# Patient Record
Sex: Female | Born: 1970
Health system: Southern US, Community
[De-identification: ages and names within clinical notes are randomized; demographics above are authoritative.]

## PROBLEM LIST (undated history)

## (undated) HISTORY — PX: CHOLECYSTECTOMY: SHX55

---

## 1999-09-23 ENCOUNTER — Other Ambulatory Visit: Admission: RE | Admit: 1999-09-23 | Discharge: 1999-09-23 | Payer: Self-pay | Admitting: Obstetrics & Gynecology

## 1999-09-24 ENCOUNTER — Encounter (INDEPENDENT_AMBULATORY_CARE_PROVIDER_SITE_OTHER): Payer: Self-pay

## 1999-09-24 ENCOUNTER — Other Ambulatory Visit: Admission: RE | Admit: 1999-09-24 | Discharge: 1999-09-24 | Payer: Self-pay | Admitting: Obstetrics & Gynecology

## 1999-11-24 ENCOUNTER — Other Ambulatory Visit: Admission: RE | Admit: 1999-11-24 | Discharge: 1999-11-24 | Payer: Self-pay | Admitting: Obstetrics & Gynecology

## 2000-02-17 ENCOUNTER — Encounter: Payer: Self-pay | Admitting: Obstetrics & Gynecology

## 2000-02-17 ENCOUNTER — Ambulatory Visit (HOSPITAL_COMMUNITY): Admission: RE | Admit: 2000-02-17 | Discharge: 2000-02-17 | Payer: Self-pay | Admitting: Obstetrics & Gynecology

## 2000-03-17 ENCOUNTER — Encounter: Payer: Self-pay | Admitting: Obstetrics & Gynecology

## 2000-03-17 ENCOUNTER — Ambulatory Visit (HOSPITAL_COMMUNITY): Admission: RE | Admit: 2000-03-17 | Discharge: 2000-03-17 | Payer: Self-pay | Admitting: Obstetrics & Gynecology

## 2000-04-07 ENCOUNTER — Encounter: Payer: Self-pay | Admitting: Occupational Medicine

## 2000-04-07 ENCOUNTER — Encounter: Admission: RE | Admit: 2000-04-07 | Discharge: 2000-04-07 | Payer: Self-pay | Admitting: Occupational Medicine

## 2000-04-19 ENCOUNTER — Other Ambulatory Visit: Admission: RE | Admit: 2000-04-19 | Discharge: 2000-04-19 | Payer: Self-pay | Admitting: Obstetrics & Gynecology

## 2000-06-03 ENCOUNTER — Inpatient Hospital Stay (HOSPITAL_COMMUNITY): Admission: AD | Admit: 2000-06-03 | Discharge: 2000-06-03 | Payer: Self-pay | Admitting: Obstetrics and Gynecology

## 2000-06-13 ENCOUNTER — Inpatient Hospital Stay (HOSPITAL_COMMUNITY): Admission: AD | Admit: 2000-06-13 | Discharge: 2000-06-15 | Payer: Self-pay | Admitting: Obstetrics & Gynecology

## 2000-07-14 ENCOUNTER — Other Ambulatory Visit: Admission: RE | Admit: 2000-07-14 | Discharge: 2000-07-14 | Payer: Self-pay | Admitting: Obstetrics & Gynecology

## 2001-03-24 ENCOUNTER — Other Ambulatory Visit: Admission: RE | Admit: 2001-03-24 | Discharge: 2001-03-24 | Payer: Self-pay | Admitting: Obstetrics and Gynecology

## 2001-09-06 ENCOUNTER — Other Ambulatory Visit: Admission: RE | Admit: 2001-09-06 | Discharge: 2001-09-06 | Payer: Self-pay | Admitting: Obstetrics and Gynecology

## 2002-01-06 ENCOUNTER — Other Ambulatory Visit: Admission: RE | Admit: 2002-01-06 | Discharge: 2002-01-06 | Payer: Self-pay | Admitting: Obstetrics and Gynecology

## 2002-04-17 ENCOUNTER — Inpatient Hospital Stay (HOSPITAL_COMMUNITY): Admission: AD | Admit: 2002-04-17 | Discharge: 2002-04-17 | Payer: Self-pay | Admitting: Obstetrics and Gynecology

## 2002-08-08 ENCOUNTER — Other Ambulatory Visit: Admission: RE | Admit: 2002-08-08 | Discharge: 2002-08-08 | Payer: Self-pay | Admitting: Obstetrics and Gynecology

## 2002-09-22 ENCOUNTER — Ambulatory Visit (HOSPITAL_COMMUNITY): Admission: RE | Admit: 2002-09-22 | Discharge: 2002-09-22 | Payer: Self-pay | Admitting: Obstetrics and Gynecology

## 2002-09-22 ENCOUNTER — Encounter: Payer: Self-pay | Admitting: Obstetrics and Gynecology

## 2002-12-04 ENCOUNTER — Inpatient Hospital Stay (HOSPITAL_COMMUNITY): Admission: AD | Admit: 2002-12-04 | Discharge: 2002-12-04 | Payer: Self-pay | Admitting: Obstetrics and Gynecology

## 2003-02-13 ENCOUNTER — Inpatient Hospital Stay (HOSPITAL_COMMUNITY): Admission: AD | Admit: 2003-02-13 | Discharge: 2003-02-15 | Payer: Self-pay | Admitting: Obstetrics and Gynecology

## 2003-08-31 ENCOUNTER — Other Ambulatory Visit: Admission: RE | Admit: 2003-08-31 | Discharge: 2003-08-31 | Payer: Self-pay | Admitting: Obstetrics and Gynecology

## 2004-04-13 ENCOUNTER — Inpatient Hospital Stay (HOSPITAL_COMMUNITY): Admission: AD | Admit: 2004-04-13 | Discharge: 2004-04-13 | Payer: Self-pay | Admitting: Obstetrics and Gynecology

## 2004-04-23 ENCOUNTER — Ambulatory Visit (HOSPITAL_COMMUNITY): Admission: RE | Admit: 2004-04-23 | Discharge: 2004-04-23 | Payer: Self-pay | Admitting: Obstetrics and Gynecology

## 2004-11-03 ENCOUNTER — Other Ambulatory Visit: Admission: RE | Admit: 2004-11-03 | Discharge: 2004-11-03 | Payer: Self-pay | Admitting: Obstetrics and Gynecology

## 2005-02-12 ENCOUNTER — Other Ambulatory Visit: Admission: RE | Admit: 2005-02-12 | Discharge: 2005-02-12 | Payer: Self-pay | Admitting: Obstetrics and Gynecology

## 2005-06-11 ENCOUNTER — Inpatient Hospital Stay (HOSPITAL_COMMUNITY): Admission: AD | Admit: 2005-06-11 | Discharge: 2005-06-11 | Payer: Self-pay | Admitting: Obstetrics and Gynecology

## 2005-08-26 ENCOUNTER — Inpatient Hospital Stay (HOSPITAL_COMMUNITY): Admission: RE | Admit: 2005-08-26 | Discharge: 2005-08-28 | Payer: Self-pay | Admitting: Obstetrics and Gynecology

## 2005-10-13 ENCOUNTER — Other Ambulatory Visit: Admission: RE | Admit: 2005-10-13 | Discharge: 2005-10-13 | Payer: Self-pay | Admitting: Obstetrics and Gynecology

## 2007-03-03 ENCOUNTER — Ambulatory Visit (HOSPITAL_COMMUNITY): Admission: RE | Admit: 2007-03-03 | Discharge: 2007-03-03 | Payer: Self-pay | Admitting: Specialist

## 2007-11-06 ENCOUNTER — Inpatient Hospital Stay (HOSPITAL_COMMUNITY): Admission: AD | Admit: 2007-11-06 | Discharge: 2007-11-06 | Payer: Self-pay | Admitting: Obstetrics and Gynecology

## 2007-12-24 ENCOUNTER — Inpatient Hospital Stay (HOSPITAL_COMMUNITY): Admission: AD | Admit: 2007-12-24 | Discharge: 2007-12-25 | Payer: Self-pay | Admitting: Obstetrics and Gynecology

## 2007-12-24 ENCOUNTER — Encounter (INDEPENDENT_AMBULATORY_CARE_PROVIDER_SITE_OTHER): Payer: Self-pay | Admitting: Obstetrics and Gynecology

## 2008-01-26 ENCOUNTER — Encounter: Admission: RE | Admit: 2008-01-26 | Discharge: 2008-01-26 | Payer: Self-pay | Admitting: General Surgery

## 2008-02-03 ENCOUNTER — Ambulatory Visit (HOSPITAL_COMMUNITY): Admission: RE | Admit: 2008-02-03 | Discharge: 2008-02-03 | Payer: Self-pay | Admitting: General Surgery

## 2008-02-03 ENCOUNTER — Encounter (INDEPENDENT_AMBULATORY_CARE_PROVIDER_SITE_OTHER): Payer: Self-pay | Admitting: General Surgery

## 2008-02-12 ENCOUNTER — Emergency Department (HOSPITAL_COMMUNITY): Admission: EM | Admit: 2008-02-12 | Discharge: 2008-02-12 | Payer: Self-pay | Admitting: Emergency Medicine

## 2008-02-28 ENCOUNTER — Ambulatory Visit: Payer: Self-pay | Admitting: Gastroenterology

## 2008-02-28 DIAGNOSIS — R1013 Epigastric pain: Secondary | ICD-10-CM | POA: Insufficient documentation

## 2008-02-28 DIAGNOSIS — R740 Nonspecific elevation of levels of transaminase and lactic acid dehydrogenase [LDH]: Secondary | ICD-10-CM

## 2008-02-28 DIAGNOSIS — Q447 Other congenital malformations of liver: Secondary | ICD-10-CM

## 2008-02-28 DIAGNOSIS — Q4479 Other congenital malformations of liver: Secondary | ICD-10-CM | POA: Insufficient documentation

## 2008-02-28 DIAGNOSIS — Q445 Other congenital malformations of bile ducts: Secondary | ICD-10-CM

## 2008-02-28 DIAGNOSIS — Q441 Other congenital malformations of gallbladder: Secondary | ICD-10-CM | POA: Insufficient documentation

## 2008-02-29 LAB — CONVERTED CEMR LAB
Albumin: 4.3 g/dL (ref 3.5–5.2)
Basophils Absolute: 0 10*3/uL (ref 0.0–0.1)
Basophils Relative: 0.6 % (ref 0.0–3.0)
Eosinophils Relative: 3.8 % (ref 0.0–5.0)
GFR calc Af Amer: 104 mL/min
Lymphocytes Relative: 28.8 % (ref 12.0–46.0)
MCHC: 35.1 g/dL (ref 30.0–36.0)
MCV: 91.1 fL (ref 78.0–100.0)
Monocytes Absolute: 0.6 10*3/uL (ref 0.1–1.0)
Monocytes Relative: 6.7 % (ref 3.0–12.0)
Neutrophils Relative %: 60.1 % (ref 43.0–77.0)
Potassium: 4.2 meq/L (ref 3.5–5.1)
RDW: 13 % (ref 11.5–14.6)
Sodium: 140 meq/L (ref 135–145)
Total Bilirubin: 1.4 mg/dL — ABNORMAL HIGH (ref 0.3–1.2)
WBC: 8.3 10*3/uL (ref 4.5–10.5)

## 2008-03-05 LAB — CONVERTED CEMR LAB
HCV Ab: NEGATIVE
Hepatitis B Surface Ag: NEGATIVE

## 2008-08-17 ENCOUNTER — Inpatient Hospital Stay (HOSPITAL_COMMUNITY): Admission: RE | Admit: 2008-08-17 | Discharge: 2008-08-20 | Payer: Self-pay | Admitting: Neurosurgery

## 2010-07-22 LAB — COMPREHENSIVE METABOLIC PANEL
AST: 22 U/L (ref 0–37)
CO2: 27 mEq/L (ref 19–32)
Creatinine, Ser: 0.75 mg/dL (ref 0.4–1.2)
GFR calc Af Amer: >60 mL/min (ref >60)
Total Bilirubin: 1 mg/dL (ref 0.3–1.2)

## 2010-07-22 LAB — URINALYSIS, ROUTINE W REFLEX MICROSCOPIC
Bilirubin Urine: NEGATIVE
Glucose, UA: NEGATIVE mg/dL
Hgb urine dipstick: NEGATIVE
Ketones, ur: NEGATIVE mg/dL
pH: 6 (ref 5.0–8.0)

## 2010-07-22 LAB — DIFFERENTIAL
Basophils Absolute: 0 10*3/uL (ref 0.0–0.1)
Eosinophils Absolute: 0.2 10*3/uL (ref 0.0–0.7)
Eosinophils Relative: 3 % (ref 0–5)
Neutro Abs: 3.9 10*3/uL (ref 1.7–7.7)

## 2010-07-22 LAB — CBC
Hemoglobin: 12.5 g/dL (ref 12.0–15.0)
MCHC: 34.7 g/dL (ref 30.0–36.0)
MCV: 93.9 fL (ref 78.0–100.0)
Platelets: 266 10*3/uL (ref 150–400)
RBC: 3.85 MIL/uL — ABNORMAL LOW (ref 3.87–5.11)
WBC: 7.1 10*3/uL (ref 4.0–10.5)

## 2010-07-22 LAB — APTT: aPTT: 31 seconds (ref 24–37)

## 2010-07-22 LAB — TYPE AND SCREEN
ABO/RH(D): O NEG
Antibody Screen: NEGATIVE

## 2010-07-22 LAB — PROTIME-INR: INR: 0.9 (ref 0.00–1.49)

## 2010-07-22 LAB — ABO/RH: ABO/RH(D): O NEG

## 2010-08-26 NOTE — Discharge Summary (Signed)
[name], [name]               ACCOUNT NO.:  [id number]   MEDICAL RECORD NO.:  [id number]          PATIENT TYPE:  INP   LOCATION:  3039                         FACILITY:  [facility]   PHYSICIAN:  Payton Doughty, M.D.      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   PROCEDURE:  1. L5-S1 laminectomy.  2. Diskectomy.  3. Posterior lumbar interbody fusion.  4. Non-segmental pedicle screw fixation at L5-S1.   COMPLICATION:  None.   DISCHARGE STATUS:  Alive and well.   BODY OF TEXT:  A 40 year old right-handed white girl whose history and  physical is recounted in the chart.  She has had an increasing back pain  and plain films that showed slip at L5, S1.  She was admitted after  ascertaining normal laboratory values and underwent a fusion at L5-S1.  Postoperatively, she has done pretty well.  Foley was removed first  postoperative day.  Second postoperative day, she was up and walking  about with PT with a walker.  She is being discharged home today to the  care of her family slightly late because she had some nausea and  vomiting last night.  She is being discharged on Percocet for pain.   Her follow up will be in the [facility] in about a week for suture  removal.           ______________________________  Payton Doughty, M.D.     MWR/MEDQ  D:  08/20/2008  T:  08/21/2008  Job:  [id number]

## 2010-08-26 NOTE — H&P (Signed)
[name], [name]               ACCOUNT NO.:  [id number]   MEDICAL RECORD NO.:  [id number]          PATIENT TYPE:  INP   LOCATION:  3039                         FACILITY:  [facility]   PHYSICIAN:  Payton Doughty, M.D.      DATE OF BIRTH:  24-Jul-1970   DATE OF ADMISSION:  08/17/2008  DATE OF DISCHARGE:                              HISTORY & PHYSICAL   ADMITTING DIAGNOSIS:  Spondylolysis of L5 with a grade I slip of L5 on  S1.   BODY OF TEXT:  This is a 40 year old right-handed white girl who has had  back pain off and on for sometime, it got worse a year ago in July 2009.  She has pain in her back and her left leg.  MR at that time showed  spondylosis and pars defect and was offered an operation and decided she  did not want to pursue it.  She has visited a neurologist with a  question of MS.  It is not entirely clear whether or not she does have  it.  However, she has persistent increasing pain in her back and down  her leg and is now admitted for decompression and stabilization at L5-  S1.   MEDICAL HISTORY:  Benign.   MEDICATIONS:  She takes oral contraceptions, vitamin D, Vicodin, and  ibuprofen.   She gets itchy with TYLOX.   SURGICAL HISTORY:  Cholecystectomy in October 2009.   SOCIAL HISTORY:  She does not smoke or drink and is a Engineer, civil (consulting).   FAMILY HISTORY:  Her mother is 96 in good health.  Her father is 84 in  good health.   REVIEW OF SYSTEMS:  Remarkable for back pain and questionable MS.   PHYSICAL EXAMINATION:  HEENT:  Normal limits.  She has good range of  motion.  NECK:  Clear.  CHEST:  Clear.  CARDIAC:  Regular rate and rhythm.  ABDOMEN:  Benign.  There are no hepatosplenomegaly.  EXTREMITIES:  Without clubbing, cyanosis.  Peripheral pulses are good.  GU:  Deferred.  NEUROLOGIC:  She is awake, alert, and oriented.  Cranial nerves are  intact.  Motor exam showed 5/5 strength throughout the upper and lower  extremities.  Pulling with her arms bothers her back more  than pushing,  which causes the pain to go down the right leg.  Reflexes are 1 at the  knees, 1 at the left ankle, and absent at right ankle.  Straight leg  raise is negative.   MR demonstrates pars defect at L5 with a grade slip of L5 on S1, does  not move a great deal may be 3 mm with flexion and extension.   CLINICAL IMPRESSION:  Lumbar spondylolysis with spondylolisthesis.  She  has thought this over and would  like to proceed with fusion at this  time.   PLAN:  The plan is for posterior lumbar interbody fusion with pedicle  screw augmentation.  The risks and benefits have been discussed with her  and she wishes to proceed.           ______________________________  Payton Doughty,  M.D.     MWR/MEDQ  D:  08/17/2008  T:  08/18/2008  Job:  [id number]

## 2010-08-26 NOTE — Op Note (Signed)
NAMEPARASKEVI, FUNEZ               ACCOUNT NO.:  [id number]   MEDICAL RECORD NO.:  [id number]          PATIENT TYPE:  INP   LOCATION:  2899                         FACILITY:  [facility]   PHYSICIAN:  Payton Doughty, M.D.      DATE OF BIRTH:  11-Jun-1970   DATE OF PROCEDURE:  08/17/2008  DATE OF DISCHARGE:                               OPERATIVE REPORT   PREOPERATIVE DIAGNOSIS:  Spondylolysis with a grade 1 slip of L5-S1.   POSTOPERATIVE DIAGNOSIS:  Spondylolysis with a grade 1 slip of L5-S1.   PROCEDURES:  L5-S1 Gill procedure, posterior lumbar antibody fusion, and  nonsegmental pedicle screws and posterolateral arthrodesis.   SURGEON:  Payton Doughty, MD   ANESTHESIA:  General endotracheal.   PREPARATION:  Prepped and draped with alcohol wipe.   COMPLICATIONS:  None.   NURSE ASSISTANT:  Bedelia Person, MD   DOCTOR ASSISTANT:  Danae Orleans. Venetia Maxon, MD.   BODY OF TEXT:  A 40 year old with spondylolysis of L5 and a grade 1 slip  of L5-S1 taken to the operating room smoothly anesthetized, intubated,  and placed prone on the operating table.  Following shave, prep and  drape in usual sterile fashion, skin was infiltrated with 1% lidocaine  and 1:400,000 epinephrine.  Skin was incised from the bottom of S1 to  mid L4 and the lamina and transverse processes of L5 and S1 were exposed  bilaterally in subperiosteal plane.  Intraoperative x-ray confirmed  correctness level.  Having confirmed correctness level, the lamina  remaining pars reticularis and inferior facet of L5 was removed using a  high-speed drill and the Kerrison.  The rostral aspect of the superior  facet of S1 was removed.  This allowed access to the lateral recess and  the decompression of the L5 roots which were both quite large.  They  were completely decompressed, the right in particular was significantly  trapped in the slip.  Following complete decompression, diskectomy was  carried out bilaterally and then PEEK cages were placed 8  mm x 21 mm.  They were packed with bone graft, harvested from the facet joints.  The  pedicle screws were then placed in L5 and S1 using the standard  landmarks and they were connected by a rod and locked down.  Intraoperative x-ray showed good placement of cages, pedicle screws, and  rods.  The transverse process at 5 and the sacral ala were decorticated  with a high-speed drill and packed with BMP on the extender matrix.  Intraoperative x-ray showed good placement.  Successive layers of 2-0  Vicryl and 3-0 nylon were used to close.  Betadine Telfa dressing was  applied, made occlusive with OpSite.  The patient returned to recovery  room in good condition.           ______________________________  Payton Doughty, M.D.     MWR/MEDQ  D:  08/17/2008  T:  08/18/2008  Job:  [id number]

## 2010-08-26 NOTE — Op Note (Signed)
NAMECYNDE, MENARD               ACCOUNT NO.:  [id number]   MEDICAL RECORD NO.:  [id number]          PATIENT TYPE:  AMB   LOCATION:  SDS                          FACILITY:  [facility]   PHYSICIAN:  Lennie Muckle, MD      DATE OF BIRTH:  July 27, 1970   DATE OF PROCEDURE:  02/03/2008  DATE OF DISCHARGE:  02/03/2008                               OPERATIVE REPORT   PREOPERATIVE DIAGNOSIS:  Symptomatic cholelithiasis with elevated liver  enzymes.   POSTOPERATIVE DIAGNOSIS:  Symptomatic cholelithiasis.   PROCEDURE:  Laparoscopic cholecystectomy with intraoperative  cholangiogram.   SURGEON:  Amber L. Freida Busman, MD   ASSISTANT:  Angelia Mould. Derrell Lolling, MD   FINDINGS:  Thickened gallbladder wall, patent common bile duct, and  right and left hepatic ducts.   SPECIMEN:  Gallbladder.   ESTIMATED BLOOD LOSS:  Minimal amount of blood loss.   DRAINS:  No drains were placed.   INDICATION FOR PROCEDURE:  Ms. Barcelo is a 40 year old female I had  seen after multiple episodes of right upper quadrant pain.  She had also  had elevation of liver enzymes during her pregnancy of alkaline  phosphatase of 688.  She had an ultrasound, which showed stones but no  dilation of the common bile duct.  Repeat enzymes had a normal ALT and  AST of 19 and 28 respectively.  Last alkaline phosphatase on January 09, 2008, was 433.  I discussed with the patient.  I felt she had  biliary colic as well as she probably had passed the stone due to  elevation of her liver enzymes.  I discussed preoperatively performing a  laparoscopic cholecystectomy with cholangiogram.  I did talk with her  about the risk of the procedure including but not limited to bleeding,  infection, hematoma, bile duct injury, and conversion to an open  procedure.  She was given a booklet on the procedure.  Due to a  thickened wall of gallbladder, I placed her on Augmentin preoperatively.   DETAILS OF PROCEDURE:  Ms. Mondesir was identified in the  preoperative  holding area.  She received a gram of cefoxitin and was taken to the  operating room.  She was taken to the operating room and placed in  supine position.  After administration of general endotracheal  anesthesia, her abdomen was prepped and draped in usual sterile fashion.  A time-out procedure indicating the patient and procedure was performed.  I placed an incision at the infraumbilical region after anesthetizing  with lidocaine.  Grasped the fascia with a Kocher clamp and placed a  Veress needle into abdominal cavity.  After adequate pneumo-  insufflation, I placed an 11-mm trocar and reached the abdominal cavity.  Abdominal walls were visualized upon entry.  Upon inspection, there was  no evidence of injury upon placement of the trocar or the Veress needle.  The patient had her head elevated, was tilted to the left side down.  I  then placed 3 additional trocars under visualization with the camera.  One was placed in the epigastric region and 2 on the right side of the  abdomen.  The gallbladder was visualized in normal anatomic position in  the gallbladder fossa.  The fundus was retracted to the head of the  patient.  I retracted the infundibulum away from the liver bed.  Using  electrocautery and the Maryland forceps, I dissected around the neck of  the gallbladder.  There was a large cystic node, which was visualized.  I fully dissected out the cystic duct and the neck of the gallbladder.  This did seem so large.  I placed in a clip distally and partially  transected the cystic duct.  I placed a cholangiogram catheter inside  the cystic duct.  There was seemed to be slight resistance.  After I  clipped the cholangiocatheter in place and flushing, the contrast seemed  to go much more easily.  Cholangiogram revealed a patent common bile  duct and right and left hepatic ducts.  I think there was an air bubble  visualized proximally and there seemed to be no filling  defects.  I then  removed the catheter, placed 2 clips as well as placed the Endoloop  across the cystic duct.  I clipped and divided the cystic artery.  I  then removed the peritoneal attachments with electrocautery.  Gallbladder was placed in an EndoCatch bag and removed from the  umbilical incision.  I irrigated the abdomen with a liter of saline.  Upon final inspection of the liver bed, there was no evidence of  bleeding.  Clips were in place.  No evidence of abdominal organ injury.  I then closed the fascial defect with 0 Vicryl suture.  Final inspection  revealed a good closure of the fascia, no bleeding or organ injury.  Pneumoperitoneum was released.  Trocars were removed.  Skin was closed  with 4-0 Monocryl.  Dermabond was placed for final dressing.  The  patient was extubated and transferred to postanesthesia care unit in  stable condition.  She was given Vicodin for pain.  She will be able to  be discharged home later if she is doing okay with terms of keeping  liquids down and having pain control.  She will follow up with me in  approximately 2 or 3 weeks.      Lennie Muckle, MD  Electronically Signed     ALA/MEDQ  D:  02/03/2008  T:  02/04/2008  Job:  [id number]   cc:   Madelon Lips, MD  Huel Cote, M.D.

## 2010-08-26 NOTE — Discharge Summary (Signed)
NAMEJERMIKA, [name]               ACCOUNT NO.:  [id number]   MEDICAL RECORD NO.:  [id number]          PATIENT TYPE:  INP   LOCATION:  9113                          [facility]AC[facility]  [facility]   PHYSICIAN:  Huel Cote, M.D. DATE OF BIRTH:  [birth year]   DATE OF ADMISSION:  12/24/2007  DATE OF DISCHARGE:  12/25/2007                               DISCHARGE SUMMARY   DISCHARGE DIAGNOSES:  1. Term pregnancy at 30 and 5/7 weeks delivered.  2. Status post normal spontaneous precipitous vaginal delivery.  3. Isolated elevated alkaline phosphatase of unclear etiology.  4. Multiple sclerosis diagnosed prior to pregnancy.   DISCHARGE MEDICATIONS:  1. Motrin 600 mg every 6 hours.  2. Darvocet 1-2 tablets p.o. every 4 hours p.r.n.   DISCHARGE FOLLOWUP:  The patient is to follow up in the office in  approximately 2 weeks for a repeat alkaline phosphatase blood draw and  in 6 weeks for her full postpartum exam.   HOSPITAL COURSE:  The patient is a 40 year old, G6, P 3-0-2-3, was  admitted at 59 and 5/7 weeks' gestation with contractions in active  labor.  Cervix was 5 cm on arrival.  Prenatal care had been complicated  by advanced maternal age, and the patient declined any genetic  screening.  She also was diagnosed prior to pregnancy with multiple  sclerosis; however, this was stable off all medications.  She had an  isolated alkaline phosphatase level which reached greater than 600 and  was reviewed with Maternal Fetal Medicine which felt that this was  probably placental in origin, but did require follow up postpartum.   Prenatal labs are as follows:  O negative, antibody negative, RPR  nonreactive, rubella immune, hepatitis B surface antigen negative, HIV  negative, GC negative, Chlamydia negative, group B Strep negative, 1-  hour Glucola 120.   PAST OBSTETRICAL HISTORY:  She had three vaginal deliveries in 2002,  2004, and 2007, and two spontaneous miscarriages in 2004 and 2005.   PAST GYN  HISTORY:  History of a LEEP in 2003.   PAST SURGICAL HISTORY:  Wisdom teeth.   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   ALLERGIES:  None.   MEDICATIONS:  Prenatal vitamins.   Shortly after the patient reached Labor and Delivery, she was found to  be complete and had rupture of membranes performed and pushed well with  a normal spontaneous vaginal delivery of a vigorous female infant over an  intact perineum, Apgars were 9 and 9, weight was 7 pounds even.  There  was a double nuchal cord reduced over the head and also a true knot.  Placenta delivery was spontaneous with no lacerations.  Cervix and  rectum were intact, and the  placenta was sent to Pathology given the elevated alkaline phosphatase  with final pathology revealing no significant pathology.  The patient  was then admitted for routine postpartum care.  Discharge alkaline  phosphatase was 685, hemoglobin was 10.7, and she was discharged with  her medications and follow up as stated.  Huel Cote, M.D.  Electronically Signed     KR/MEDQ  D:  01/16/2008  T:  01/17/2008  Job:  [id number]

## 2010-08-29 NOTE — Discharge Summary (Signed)
[name], [name]                         ACCOUNT NO.:  [id number]   MEDICAL RECORD NO.:  [id number]                   PATIENT TYPE:  INP   LOCATION:  9147                                 FACILITY:  [facility]   PHYSICIAN:  Zenaida Niece, M.D.             DATE OF BIRTH:  02-28-1971   DATE OF ADMISSION:  02/13/2003  DATE OF DISCHARGE:  02/15/2003                                 DISCHARGE SUMMARY   ADMISSION DIAGNOSES:  Intrauterine pregnancy at 38 weeks.   DISCHARGE DIAGNOSES:  Intrauterine pregnancy at 38 weeks.   PROCEDURE:  On November 2 she had spontaneous vaginal delivery.   HISTORY AND PHYSICAL:  This is a 40 year old white female gravida 3, para 1-  0-1-1 with an EGA of 38+ weeks who presents for induction.  Prenatal care  uncomplicated.  She did receive RhoGAM for Rh negative.   PRENATAL LABORATORIES:  Blood type O- with a negative antibody screen.  Rubella immune.  Hepatitis B surface antigen negative.  HIV negative.  Gonorrhea and Chlamydia negative.  RPR nonreactive.  Group B Strep is  negative.   PAST OBSTETRICAL HISTORY:  In 2002 vaginal delivery of 7 pounds 8 ounces.  In 2004 spontaneous abortion.   GYN HISTORY:  LEEP in 2003.   PAST MEDICAL HISTORY:  Gastroesophageal reflux.   PAST SURGICAL HISTORY:  Wisdom tooth removal.   PHYSICAL EXAMINATION:  VITAL SIGNS:  She is afebrile with stable vital  signs.  Fetal heart tracing reactive with occasional contraction.  ABDOMEN:  Soft, nontender.  PELVIC:  Cervix is 1+, 70, -2 and amniotomy revealed clear fluid.   HOSPITAL COURSE:  The patient was admitted and started on Pitocin for  induction.  Huel Cote, M.D. then examined her and performed  amniotomy.  She progressed to active labor and received an epidural.  On the  afternoon of November 2 she got to complete, pushed well, and had a vaginal  delivery of a viable female infant with Apgars of 8 and 9 that weighed 7  pounds 10 ounces.  Nuchal cord x1 was  reduced.  Placenta delivered  spontaneous, was intact with a three vessel cord.  She had a small skid mark  at the right upper labia which was hemostatic and not repaired.  Estimated  blood loss 350 mL.  Postpartum she had no complications.  Pre delivery  hemoglobin 11.7, post delivery 10.1.  On postpartum day #2 she was stable  for discharge home.    DISCHARGE INSTRUCTIONS:  Regular diet.  Pelvic rest.  Follow up in six  weeks.  Medications are over-the-counter ibuprofen as needed.  She is given  our discharge pamphlet.  Zenaida Niece, M.D.    TDM/MEDQ  D:  02/15/2003  T:  02/15/2003  Job:  [id number]

## 2010-08-29 NOTE — Discharge Summary (Signed)
Ross, Madison               ACCOUNT NO.:  [id number]   MEDICAL RECORD NO.:  [id number]          PATIENT TYPE:  INP   LOCATION:  9122                          FACILITY:  [facility]   PHYSICIAN:  Huel Cote, M.D. DATE OF BIRTH:  Apr 04, 1971   DATE OF ADMISSION:  08/26/2005  DATE OF DISCHARGE:  08/28/2005                                 DISCHARGE SUMMARY   DISCHARGE DIAGNOSES:  1.  Term pregnancy at 38+ weeks, delivered.  2.  Status post normal spontaneous vaginal delivery.   DISCHARGE MEDICATIONS:  1.  Motrin 600 mg p.o. every six hours.  2.  Percocet one to two tablets p.o. every four hours p.r.n.   DISCHARGE FOLLOWUP:  The patient is to follow up in the office in six weeks  for routine postpartum examination.   HOSPITAL COURSE:  The patient is a 40 year old G5, P 2-0-2-2 who was  admitted at 38+ weeks gestation for induction of labor given term status and  favorable cervix.  Prenatal care was uncomplicated.   PRENATAL LABORATORIES:  O+, antibody negative, RPR nonreactive, rubella  immune, hepatitis B surface antigen negative, HIV declined, GC negative,  Chlamydia negative, group B Strep negative, triple screen normal.   PAST OBSTETRICAL HISTORY:  She had a vaginal delivery of a 7 pound 8 ounce  infant in 2002, 2004 she had spontaneous miscarriage, vaginal delivery in  November of 2004 of a 7 pound 10 ounce infant, and spontaneous miscarriage  in 2005.   PAST GYN HISTORY:  A LEEP in 2003, within normal after that.   PAST MEDICAL HISTORY:  Reflux disease.   PAST SURGICAL HISTORY:  Wisdom teeth.   No allergies.  No medications.  On admission she was afebrile with stable  vital signs.  Fetal heart rate was reactive.  Cervix was 51, plus, and -2.  She had rupture of membranes performed with clear fluid noted.  She reached  complete dilation and pushed well with a normal spontaneous vaginal delivery  of vigorous female infant over an intact  perineum.  Apgars were 9 and 9.   Weight was 7 pounds 8 ounces.  Placenta  delivered spontaneously.  She then admitted for routine postpartum care.  Her postpartum hemoglobin was 9.7.  By postpartum day #2 her pain was well  controlled and she was felt stable for discharge home.      Huel Cote, M.D.  Electronically Signed     KR/MEDQ  D:  09/23/2005  T:  09/23/2005  Job:  [id number]

## 2011-01-09 LAB — COMPREHENSIVE METABOLIC PANEL
ALT: 19
BUN: 4 — ABNORMAL LOW
CO2: 24
Creatinine, Ser: 0.5
GFR calc non Af Amer: >60
Potassium: 3.7

## 2011-01-09 LAB — URINALYSIS, ROUTINE W REFLEX MICROSCOPIC
Ketones, ur: >80 — AB
Leukocytes, UA: NEGATIVE
Protein, ur: 30 — AB
pH: 6

## 2011-01-09 LAB — URINE MICROSCOPIC-ADD ON

## 2011-01-13 LAB — CBC
HCT: 37.1
HCT: 38.8
Hemoglobin: 12.4
Hemoglobin: 12.9
MCHC: 33.4
MCV: 94.4
Platelets: 267
RDW: 13.7
RDW: 13.5
WBC: 13.2 — ABNORMAL HIGH

## 2011-01-13 LAB — POCT CARDIAC MARKERS
CKMB, poc: 1.8
Myoglobin, poc: 73.5

## 2011-01-13 LAB — COMPREHENSIVE METABOLIC PANEL
ALT: 289 — ABNORMAL HIGH
Albumin: 4.2
Alkaline Phosphatase: 222 — ABNORMAL HIGH
BUN: 8
Chloride: 103
Glucose, Bld: 96
Potassium: 4
Sodium: 137
Total Bilirubin: 1.5 — ABNORMAL HIGH
Total Protein: 7

## 2011-01-13 LAB — URINALYSIS, ROUTINE W REFLEX MICROSCOPIC
Bilirubin Urine: NEGATIVE
Glucose, UA: NEGATIVE
Hgb urine dipstick: NEGATIVE
Ketones, ur: NEGATIVE
Protein, ur: NEGATIVE
Urobilinogen, UA: 0.2

## 2011-01-13 LAB — URINE MICROSCOPIC-ADD ON

## 2011-01-13 LAB — LIPASE, BLOOD: Lipase: 29

## 2011-01-13 LAB — POCT PREGNANCY, URINE: Preg Test, Ur: NEGATIVE

## 2011-01-14 LAB — RPR: RPR Ser Ql: NONREACTIVE

## 2011-01-14 LAB — CBC
HCT: 31.7 — ABNORMAL LOW
Hemoglobin: 10.7 — ABNORMAL LOW
Hemoglobin: 11.9 — ABNORMAL LOW
RBC: 3.33 — ABNORMAL LOW
RBC: 3.74 — ABNORMAL LOW
WBC: 15.4 — ABNORMAL HIGH

## 2011-01-14 LAB — ALKALINE PHOSPHATASE: Alkaline Phosphatase: 685 — ABNORMAL HIGH

## 2011-01-14 LAB — RH IMMUNE GLOB WKUP(>/=20WKS)(NOT WOMEN'S HOSP): Fetal Screen: NEGATIVE

## 2014-04-25 ENCOUNTER — Other Ambulatory Visit (HOSPITAL_COMMUNITY): Payer: Self-pay | Admitting: Neurology

## 2014-04-25 DIAGNOSIS — G35 Multiple sclerosis: Secondary | ICD-10-CM

## 2014-05-09 ENCOUNTER — Ambulatory Visit (HOSPITAL_COMMUNITY): Payer: Self-pay

## 2014-05-14 ENCOUNTER — Ambulatory Visit (HOSPITAL_COMMUNITY)
Admission: RE | Admit: 2014-05-14 | Discharge: 2014-05-14 | Disposition: A | Discharge status: Home / Self Care | Payer: 59 | Source: Ambulatory Visit | Attending: Neurology | Admitting: Neurology

## 2014-05-14 DIAGNOSIS — G35 Multiple sclerosis: Secondary | ICD-10-CM

## 2014-05-14 DIAGNOSIS — G939 Disorder of brain, unspecified: Secondary | ICD-10-CM | POA: Insufficient documentation

## 2014-05-14 MED ORDER — GADOBENATE DIMEGLUMINE 529 MG/ML IV SOLN
17.0000 mL | Freq: Once | INTRAVENOUS | Status: AC | PRN
  Administered 2014-05-14: 17 mL via INTRAVENOUS

## 2015-06-17 ENCOUNTER — Ambulatory Visit (INDEPENDENT_AMBULATORY_CARE_PROVIDER_SITE_OTHER): Payer: 59

## 2015-06-17 ENCOUNTER — Encounter: Payer: Self-pay | Admitting: Podiatry

## 2015-06-17 ENCOUNTER — Ambulatory Visit (INDEPENDENT_AMBULATORY_CARE_PROVIDER_SITE_OTHER): Payer: 59 | Admitting: Podiatry

## 2015-06-17 VITALS — BP 114/74 | HR 87 | Resp 14

## 2015-06-17 DIAGNOSIS — R52 Pain, unspecified: Secondary | ICD-10-CM

## 2015-06-17 DIAGNOSIS — M722 Plantar fascial fibromatosis: Secondary | ICD-10-CM

## 2015-06-17 MED ORDER — METHYLPREDNISOLONE 4 MG PO TBPK: ORAL_TABLET | ORAL | Status: AC

## 2015-06-17 NOTE — Progress Notes (Signed)
Subjective:     Patient ID: [name], female   DOB: 01-Jan-1971, 45 y.o.   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 She presents the office today stating that her pain has been present on and off for over 1 year. She self diagnosed herself with plantar fasciitis and stopped wearing her flip-flops this initially helped but the pain has worsened. She has tried to self treat by wearing braces better shoes taking Mobitz that was prescribed and applying Aspercreme to her heels. She presents the office today stating that her pain is significant and that she has worked on weekend. She says the pain is severe upon rising from a sitting position. She also admits to toe walking which has led to muscle pain in the back of her legs. Finally, she says that her arch pain has now settled into her heels and that the right heel is more painful than the left heel. She presents the office today for an evaluation and treatment of her feet   Review of Systems     Objective:   Physical Exam GENERAL APPEARANCE: Alert, conversant. Appropriately groomed. No acute distress.  VASCULAR: Pedal pulses palpable at  [facility] and PT bilateral.  Capillary refill time is immediate to all digits,  Normal temperature gradient.  Digital hair growth is present bilateral  NEUROLOGIC: sensation is normal to 5.07 monofilament at 5/5 sites bilateral.  Light touch is intact bilateral, Muscle strength normal.  MUSCULOSKELETAL: acceptable muscle strength, tone and stability bilateral.  Intrinsic muscluature intact bilateral.  Rectus appearance of foot and digits noted bilateral. No pain along the course of the plantar fascia B/L.  Pain in heels at the insertion plantar fascia B/L  DERMATOLOGIC: skin color, texture, and turgor are within normal limits.  No preulcerative lesions or ulcers  are seen, no interdigital maceration noted.  No open  lesions present.  Digital nails are asymptomatic. No drainage noted.     Assessment:     Plantar fascitis B/L     Plan:     IE  Xrays reveal minimal calcification heels B/L.  Discussed icing her feet, stretching her feet, wear powerstep insoles and prescribe Medrol dosepak.  RTC 2 weeks for reevaluation.   Helane Gunther DPM

## 2015-07-15 ENCOUNTER — Ambulatory Visit: Payer: 59 | Admitting: Podiatry

## 2015-08-27 DIAGNOSIS — M79604 Pain in right leg: Secondary | ICD-10-CM | POA: Diagnosis not present

## 2015-08-27 DIAGNOSIS — I00 Rheumatic fever without heart involvement: Secondary | ICD-10-CM | POA: Diagnosis not present

## 2015-08-27 DIAGNOSIS — M79605 Pain in left leg: Secondary | ICD-10-CM | POA: Diagnosis not present

## 2015-08-27 DIAGNOSIS — M79671 Pain in right foot: Secondary | ICD-10-CM | POA: Diagnosis not present

## 2015-12-19 DIAGNOSIS — E559 Vitamin D deficiency, unspecified: Secondary | ICD-10-CM | POA: Diagnosis not present

## 2015-12-19 DIAGNOSIS — Z1151 Encounter for screening for human papillomavirus (HPV): Secondary | ICD-10-CM | POA: Diagnosis not present

## 2015-12-19 DIAGNOSIS — Z124 Encounter for screening for malignant neoplasm of cervix: Secondary | ICD-10-CM | POA: Diagnosis not present

## 2015-12-19 DIAGNOSIS — Z1389 Encounter for screening for other disorder: Secondary | ICD-10-CM | POA: Diagnosis not present

## 2015-12-19 DIAGNOSIS — Z6829 Body mass index (BMI) 29.0-29.9, adult: Secondary | ICD-10-CM | POA: Diagnosis not present

## 2015-12-19 DIAGNOSIS — Z0000 Encounter for general adult medical examination without abnormal findings: Secondary | ICD-10-CM | POA: Diagnosis not present

## 2015-12-19 DIAGNOSIS — Z01419 Encounter for gynecological examination (general) (routine) without abnormal findings: Secondary | ICD-10-CM | POA: Diagnosis not present

## 2015-12-19 DIAGNOSIS — Z1231 Encounter for screening mammogram for malignant neoplasm of breast: Secondary | ICD-10-CM | POA: Diagnosis not present

## 2015-12-19 DIAGNOSIS — Z130 Encounter for screening for diseases of the blood and blood-forming organs and certain disorders involving the immune mechanism: Secondary | ICD-10-CM | POA: Diagnosis not present

## 2016-01-13 ENCOUNTER — Emergency Department (HOSPITAL_BASED_OUTPATIENT_CLINIC_OR_DEPARTMENT_OTHER)
Admission: EM | Admit: 2016-01-13 | Discharge: 2016-01-13 | Disposition: A | Discharge status: Home / Self Care | Payer: 59 | Attending: Emergency Medicine | Admitting: Emergency Medicine

## 2016-01-13 ENCOUNTER — Encounter (HOSPITAL_BASED_OUTPATIENT_CLINIC_OR_DEPARTMENT_OTHER): Payer: Self-pay | Admitting: Emergency Medicine

## 2016-01-13 DIAGNOSIS — E860 Dehydration: Secondary | ICD-10-CM | POA: Diagnosis not present

## 2016-01-13 DIAGNOSIS — R112 Nausea with vomiting, unspecified: Secondary | ICD-10-CM | POA: Diagnosis present

## 2016-01-13 DIAGNOSIS — K529 Noninfective gastroenteritis and colitis, unspecified: Secondary | ICD-10-CM

## 2016-01-13 DIAGNOSIS — Z79899 Other long term (current) drug therapy: Secondary | ICD-10-CM | POA: Insufficient documentation

## 2016-01-13 LAB — CBC WITH DIFFERENTIAL/PLATELET
BASOS ABS: 0 10*3/uL (ref 0.0–0.1)
BASOS PCT: 0 %
EOS ABS: 0.1 10*3/uL (ref 0.0–0.7)
EOS PCT: 0 %
HEMATOCRIT: 42.4 % (ref 36.0–46.0)
Hemoglobin: 13.9 g/dL (ref 12.0–15.0)
Lymphocytes Relative: 3 %
Lymphs Abs: 0.4 10*3/uL — ABNORMAL LOW (ref 0.7–4.0)
MCH: 31.8 pg (ref 26.0–34.0)
MCHC: 32.8 g/dL (ref 30.0–36.0)
MCV: 97 fL (ref 78.0–100.0)
MONO ABS: 0.4 10*3/uL (ref 0.1–1.0)
MONOS PCT: 3 %
NEUTROS ABS: 13.8 10*3/uL — AB (ref 1.7–7.7)
Neutrophils Relative %: 94 %
PLATELETS: 271 10*3/uL (ref 150–400)
RBC: 4.37 MIL/uL (ref 3.87–5.11)
RDW: 12.7 % (ref 11.5–15.5)
WBC: 14.7 10*3/uL — ABNORMAL HIGH (ref 4.0–10.5)

## 2016-01-13 LAB — COMPREHENSIVE METABOLIC PANEL
ALBUMIN: 4.5 g/dL (ref 3.5–5.0)
ALT: 16 U/L (ref 14–54)
ANION GAP: 7 (ref 5–15)
AST: 19 U/L (ref 15–41)
Alkaline Phosphatase: 47 U/L (ref 38–126)
BILIRUBIN TOTAL: 1.5 mg/dL — AB (ref 0.3–1.2)
BUN: 17 mg/dL (ref 6–20)
CHLORIDE: 105 mmol/L (ref 101–111)
CO2: 26 mmol/L (ref 22–32)
Calcium: 8.9 mg/dL (ref 8.9–10.3)
Creatinine, Ser: 0.84 mg/dL (ref 0.44–1.00)
GFR calc Af Amer: >60 mL/min (ref >60)
GFR calc non Af Amer: >60 mL/min (ref >60)
GLUCOSE: 121 mg/dL — AB (ref 65–99)
POTASSIUM: 4.4 mmol/L (ref 3.5–5.1)
SODIUM: 138 mmol/L (ref 135–145)
TOTAL PROTEIN: 7.4 g/dL (ref 6.5–8.1)

## 2016-01-13 LAB — LIPASE, BLOOD: Lipase: 30 U/L (ref 11–51)

## 2016-01-13 MED ORDER — SODIUM CHLORIDE 0.9 % IV BOLUS (SEPSIS)
1000.0000 mL | Freq: Once | INTRAVENOUS | Status: AC
  Administered 2016-01-13: 1000 mL via INTRAVENOUS

## 2016-01-13 MED ORDER — FAMOTIDINE IN NACL 20-0.9 MG/50ML-% IV SOLN
20.0000 mg | Freq: Once | INTRAVENOUS | Status: AC
  Administered 2016-01-13: 20 mg via INTRAVENOUS
  Filled 2016-01-13: qty 50

## 2016-01-13 MED ORDER — ONDANSETRON HCL 4 MG/2ML IJ SOLN
4.0000 mg | Freq: Once | INTRAMUSCULAR | Status: AC
  Administered 2016-01-13: 4 mg via INTRAVENOUS
  Filled 2016-01-13: qty 2

## 2016-01-13 MED ORDER — PROMETHAZINE HCL 25 MG PO TABS: 25.0000 mg | ORAL_TABLET | Freq: Four times a day (QID) | ORAL | 0 refills | Status: AC | PRN

## 2016-01-13 MED FILL — PROMETHAZINE 25 MG TABLET: 25 | 4 days supply | Qty: 15 | Fill #0

## 2016-01-13 NOTE — ED Triage Notes (Signed)
Pt with N/V/D symptoms since 0400. State she and husband both ate fish prepared by a restaurant and have since developed weakness from vomiting x6 times and diarrhea x6.

## 2016-01-13 NOTE — ED Provider Notes (Signed)
[facility] Provider [facility]   CSN: [id number] Arrival date & time: 01/13/16  5176     History   Chief Complaint Chief Complaint  Patient presents with  . Nausea  . Emesis  . Diarrhea  . Weakness    HPI [name] is a 45 y.o. female.  The history is provided by the patient.  Emesis   This is a new problem. The current episode started 3 to 5 hours ago. The problem occurs 5 to 10 times per day. The problem has not changed since onset.The emesis has an appearance of stomach contents. There has been no fever. Associated symptoms include abdominal pain and diarrhea. Associated symptoms comments: abd cramping. Risk factors include suspect food intake (she and husband ate fish last night and both are not feeling well).  Diarrhea   Associated symptoms include abdominal pain and vomiting.  Weakness  Associated symptoms include vomiting.    History reviewed. No pertinent past medical history.  Patient Active Problem List   Diagnosis Date Noted  . 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 EPIGASTRIC PAIN 02/28/2008  . ABNORMAL TRANSAMINASE-LFT'S 02/28/2008    Past Surgical History:  Procedure Laterality Date  . CHOLECYSTECTOMY      OB History    No data available       Home Medications    Prior to Admission medications   Medication Sig Start Date End Date Taking? Authorizing Provider  methylPREDNISolone (MEDROL DOSEPAK) 4 MG TBPK tablet As directed 06/17/15   Helane Gunther, DPM    Family History No family history on file.  Social History Social History  Substance Use Topics  . Smoking status: Never Smoker  . Smokeless tobacco: Never Used  . Alcohol use No     Allergies   Diclofenac; Diclofenac sodium; and Oxycodone-acetaminophen   Review of Systems Review of Systems  Gastrointestinal: Positive for abdominal pain, diarrhea and vomiting.  Neurological: Positive for weakness.  All other systems reviewed and are  negative.    Physical Exam Updated Vital Signs BP 124/89 (BP Location: Left Arm)   Pulse 115   Temp 97.9 F (36.6 C) (Oral)   Resp 18   Ht 5\' 5"  (1.651 m)   Wt 180 lb (81.6 kg)   SpO2 100%   BMI 29.95 kg/m   Physical Exam  Constitutional: She is oriented to person, place, and time. She appears well-developed and well-nourished. No distress.  HENT:  Head: Normocephalic and atraumatic.  Mouth/Throat: Oropharynx is clear and moist. Mucous membranes are dry.  Eyes: Conjunctivae and EOM are normal. Pupils are equal, round, and reactive to light.  Neck: Normal range of motion. Neck supple.  Cardiovascular: Normal rate, regular rhythm and intact distal pulses.   No murmur heard. Pulmonary/Chest: Effort normal and breath sounds normal. No respiratory distress. She has no wheezes. She has no rales.  Abdominal: Soft. She exhibits no distension. There is tenderness in the epigastric area. There is no rebound and no guarding.  Musculoskeletal: Normal range of motion. She exhibits no edema or tenderness.  Neurological: She is alert and oriented to person, place, and time.  Skin: Skin is warm and dry. No rash noted. No erythema.  Psychiatric: She has a normal mood and affect. Her behavior is normal.  Nursing note and vitals reviewed.    ED Treatments / Results  Labs (all labs ordered are listed, but only abnormal results are displayed) Labs Reviewed  CBC WITH DIFFERENTIAL/PLATELET - Abnormal; Notable for the following:  Result Value   WBC 14.7 (*)    Neutro Abs 13.8 (*)    Lymphs Abs 0.4 (*)    All other components within normal limits  COMPREHENSIVE METABOLIC PANEL - Abnormal; Notable for the following:    Glucose, Bld 121 (*)    Total Bilirubin 1.5 (*)    All other components within normal limits  LIPASE, BLOOD    EKG  EKG Interpretation None       Radiology No results found.  Procedures Procedures (including critical care time)  Medications Ordered in  ED Medications  ondansetron (ZOFRAN) injection 4 mg (not administered)  sodium chloride 0.9 % bolus 1,000 mL (not administered)     Initial Impression / Assessment and Plan / ED Course  I have reviewed the triage vital signs and the nursing notes.  Pertinent labs & imaging results that were available during my care of the patient were reviewed by me and considered in my medical decision making (see chart for details).  Clinical Course   Pt with symptoms most consistent with a viral process with fever/vomitting/diarrhea.  Denies bad food exposure and recent travel out of the country.  No recent abx.  No hx concerning for GU pathology or kidney stones.  Pt is awake and alert on exam without peritoneal signs.  Moderate epigastric tenderness.  No excessive etoh use. Leukocytosis but normal lft's and lipase.  Pt improved after IVF and zofran.  D/ced home with phenergan per pt request.    Final Clinical Impressions(s) / ED Diagnoses   Final diagnoses:  Gastroenteritis  Dehydration    New Prescriptions New Prescriptions   PROMETHAZINE (PHENERGAN) 25 MG TABLET    Take 1 tablet (25 mg total) by mouth every 6 (six) hours as needed for nausea or vomiting.     Gwyneth SproutWhitney Allen Egerton, MD 01/13/16 [phone]

## 2016-01-13 NOTE — ED Notes (Signed)
Pt fluids offered to pt. Tolerated well.

## 2016-01-13 NOTE — ED Notes (Signed)
MD at bedside. 

## 2016-01-13 NOTE — ED Notes (Signed)
Per EDP pt ready for discharge. EMT removing IV.

## 2017-02-16 DIAGNOSIS — S93601A Unspecified sprain of right foot, initial encounter: Secondary | ICD-10-CM | POA: Diagnosis not present

## 2017-02-16 DIAGNOSIS — S93401D Sprain of unspecified ligament of right ankle, subsequent encounter: Secondary | ICD-10-CM | POA: Diagnosis not present

## 2017-02-24 DIAGNOSIS — S63501S Unspecified sprain of right wrist, sequela: Secondary | ICD-10-CM | POA: Diagnosis not present

## 2017-02-24 DIAGNOSIS — E538 Deficiency of other specified B group vitamins: Secondary | ICD-10-CM | POA: Diagnosis not present

## 2017-02-24 DIAGNOSIS — Z1339 Encounter for screening examination for other mental health and behavioral disorders: Secondary | ICD-10-CM | POA: Diagnosis not present

## 2017-02-24 DIAGNOSIS — Z6830 Body mass index (BMI) 30.0-30.9, adult: Secondary | ICD-10-CM | POA: Diagnosis not present

## 2017-02-24 DIAGNOSIS — M19071 Primary osteoarthritis, right ankle and foot: Secondary | ICD-10-CM | POA: Diagnosis not present

## 2017-02-24 DIAGNOSIS — Z13220 Encounter for screening for lipoid disorders: Secondary | ICD-10-CM | POA: Diagnosis not present

## 2017-02-24 DIAGNOSIS — Z1331 Encounter for screening for depression: Secondary | ICD-10-CM | POA: Diagnosis not present

## 2017-02-24 DIAGNOSIS — E559 Vitamin D deficiency, unspecified: Secondary | ICD-10-CM | POA: Diagnosis not present

## 2017-03-02 DIAGNOSIS — M19071 Primary osteoarthritis, right ankle and foot: Secondary | ICD-10-CM | POA: Diagnosis not present

## 2017-03-02 DIAGNOSIS — D510 Vitamin B12 deficiency anemia due to intrinsic factor deficiency: Secondary | ICD-10-CM | POA: Diagnosis not present

## 2017-03-09 DIAGNOSIS — E538 Deficiency of other specified B group vitamins: Secondary | ICD-10-CM | POA: Diagnosis not present

## 2017-03-17 DIAGNOSIS — D510 Vitamin B12 deficiency anemia due to intrinsic factor deficiency: Secondary | ICD-10-CM | POA: Diagnosis not present

## 2017-04-16 DIAGNOSIS — E538 Deficiency of other specified B group vitamins: Secondary | ICD-10-CM | POA: Diagnosis not present

## 2017-07-15 DIAGNOSIS — Z124 Encounter for screening for malignant neoplasm of cervix: Secondary | ICD-10-CM | POA: Diagnosis not present

## 2017-07-15 DIAGNOSIS — Z1389 Encounter for screening for other disorder: Secondary | ICD-10-CM | POA: Diagnosis not present

## 2017-07-15 DIAGNOSIS — Z130 Encounter for screening for diseases of the blood and blood-forming organs and certain disorders involving the immune mechanism: Secondary | ICD-10-CM | POA: Diagnosis not present

## 2017-07-15 DIAGNOSIS — N939 Abnormal uterine and vaginal bleeding, unspecified: Secondary | ICD-10-CM | POA: Diagnosis not present

## 2017-07-15 DIAGNOSIS — Z01419 Encounter for gynecological examination (general) (routine) without abnormal findings: Secondary | ICD-10-CM | POA: Diagnosis not present

## 2017-07-15 DIAGNOSIS — H524 Presbyopia: Secondary | ICD-10-CM | POA: Diagnosis not present

## 2017-07-15 DIAGNOSIS — Z6830 Body mass index (BMI) 30.0-30.9, adult: Secondary | ICD-10-CM | POA: Diagnosis not present

## 2017-07-15 DIAGNOSIS — Z0000 Encounter for general adult medical examination without abnormal findings: Secondary | ICD-10-CM | POA: Diagnosis not present

## 2017-07-15 DIAGNOSIS — Z1231 Encounter for screening mammogram for malignant neoplasm of breast: Secondary | ICD-10-CM | POA: Diagnosis not present

## 2019-01-26 ENCOUNTER — Encounter (HOSPITAL_COMMUNITY): Payer: Self-pay | Admitting: Emergency Medicine

## 2019-01-26 ENCOUNTER — Emergency Department (HOSPITAL_COMMUNITY)
Admission: EM | Admit: 2019-01-26 | Discharge: 2019-01-26 | Disposition: A | Discharge status: Home / Self Care | Payer: 59 | Attending: Emergency Medicine | Admitting: Emergency Medicine

## 2019-01-26 ENCOUNTER — Emergency Department (HOSPITAL_COMMUNITY): Payer: 59

## 2019-01-26 ENCOUNTER — Other Ambulatory Visit: Payer: Self-pay

## 2019-01-26 DIAGNOSIS — R1010 Upper abdominal pain, unspecified: Secondary | ICD-10-CM | POA: Insufficient documentation

## 2019-01-26 DIAGNOSIS — Z79899 Other long term (current) drug therapy: Secondary | ICD-10-CM | POA: Insufficient documentation

## 2019-01-26 DIAGNOSIS — R109 Unspecified abdominal pain: Secondary | ICD-10-CM | POA: Diagnosis not present

## 2019-01-26 DIAGNOSIS — K429 Umbilical hernia without obstruction or gangrene: Secondary | ICD-10-CM | POA: Diagnosis not present

## 2019-01-26 DIAGNOSIS — R1011 Right upper quadrant pain: Secondary | ICD-10-CM | POA: Diagnosis not present

## 2019-01-26 LAB — CBC
HCT: 47.5 % — ABNORMAL HIGH (ref 36.0–46.0)
Hemoglobin: 15.2 g/dL — ABNORMAL HIGH (ref 12.0–15.0)
MCH: 31.7 pg (ref 26.0–34.0)
MCHC: 32 g/dL (ref 30.0–36.0)
MCV: 99 fL (ref 80.0–100.0)
Platelets: 322 10*3/uL (ref 150–400)
RBC: 4.8 MIL/uL (ref 3.87–5.11)
RDW: 12.8 % (ref 11.5–15.5)
WBC: 7.1 10*3/uL (ref 4.0–10.5)
nRBC: 0 % (ref 0.0–0.2)

## 2019-01-26 LAB — COMPREHENSIVE METABOLIC PANEL
ALT: 23 U/L (ref 0–44)
AST: 22 U/L (ref 15–41)
Albumin: 4.9 g/dL (ref 3.5–5.0)
Alkaline Phosphatase: 66 U/L (ref 38–126)
Anion gap: 10 (ref 5–15)
BUN: 12 mg/dL (ref 6–20)
CO2: 26 mmol/L (ref 22–32)
Calcium: 9.5 mg/dL (ref 8.9–10.3)
Chloride: 103 mmol/L (ref 98–111)
Creatinine, Ser: 0.79 mg/dL (ref 0.44–1.00)
GFR calc Af Amer: >60 mL/min (ref >60)
GFR calc non Af Amer: >60 mL/min (ref >60)
Glucose, Bld: 91 mg/dL (ref 70–99)
Potassium: 4 mmol/L (ref 3.5–5.1)
Sodium: 139 mmol/L (ref 135–145)
Total Bilirubin: 1.5 mg/dL — ABNORMAL HIGH (ref 0.3–1.2)
Total Protein: 8.7 g/dL — ABNORMAL HIGH (ref 6.5–8.1)

## 2019-01-26 LAB — URINALYSIS, ROUTINE W REFLEX MICROSCOPIC
Bilirubin Urine: NEGATIVE
Glucose, UA: NEGATIVE mg/dL
Hgb urine dipstick: NEGATIVE
Ketones, ur: NEGATIVE mg/dL
Leukocytes,Ua: NEGATIVE
Nitrite: NEGATIVE
Protein, ur: NEGATIVE mg/dL
Specific Gravity, Urine: 1.012 (ref 1.005–1.030)
pH: 7 (ref 5.0–8.0)

## 2019-01-26 LAB — I-STAT BETA HCG BLOOD, ED (MC, WL, AP ONLY): I-stat hCG, quantitative: <5 m[IU]/mL (ref <5)

## 2019-01-26 LAB — LIPASE, BLOOD: Lipase: 35 U/L (ref 11–51)

## 2019-01-26 MED ORDER — SUCRALFATE 1 G PO TABS: 1.0000 g | ORAL_TABLET | Freq: Four times a day (QID) | ORAL | 0 refills | Status: AC

## 2019-01-26 MED ORDER — OMEPRAZOLE 20 MG PO CPDR: 20.0000 mg | DELAYED_RELEASE_CAPSULE | Freq: Every day | ORAL | 0 refills | Status: AC

## 2019-01-26 MED ORDER — CYCLOBENZAPRINE HCL 10 MG PO TABS: 10.0000 mg | ORAL_TABLET | Freq: Two times a day (BID) | ORAL | 0 refills | Status: AC | PRN

## 2019-01-26 NOTE — Discharge Instructions (Signed)
Your CT scan showed no explanation for abdominal pain and your blood work is reassuring.  As discussed, we will treat this as peptic ulcer disease and he will take Prilosec for the next 14 days. Additionally you may use flexeril as needed for discomfort.  Please limit large meals as these elicit worsening pain.  Follow-up with with our gastroenterology for additional work-up outpatient. Use tylenol for pain please limit use of ibuprofen for the time being.   Please use sucralfate as well. This is a medication that provides an additional coating of protection for your stomach.

## 2019-01-26 NOTE — ED Triage Notes (Signed)
Per pt, states she has right upper back pain after eating heavy meals for over a month-states she had her gallbladder removed in 2012

## 2019-01-26 NOTE — ED Provider Notes (Signed)
[facility]-EMERGENCY DEPT Provider Note   CSN: [id number] Arrival date & time: 01/26/19  1106     History   Chief Complaint Chief Complaint  Patient presents with  . Abdominal Pain    HPI [name] is a 48 y.o. female history of GERD, back pain, and cholecystitis status post cholecystectomy 2012.  Patient complains of 1 month of intermittent back pain that radiates under her ribs bilaterally.  These episodes occur 1-2 times per week and occur 1 to 2 hours after eating a large meal.  Patient states that pain is 6/10 when it occurs and can last several hours.  Patient states the right side is worse than left under her rib cage.  Patient states most recent occurrence was last night when she ate at [facility] a large meal including steak and potatoes.  Patient states that she uses ibuprofen daily approximately 400 mg.  Patient also uses Nexium and Pepcid without relief.  Patient states no position makes the pain better although she occasionally gets mild relief with laying on her right side.  Patient denies any urinary symptoms, changes in bowel movements, vaginal discharge, concern for STIs, denies any chest pain.    HPI  History reviewed. No pertinent past medical history.  Patient Active Problem List   Diagnosis Date Noted  . 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 EPIGASTRIC PAIN 02/28/2008  . ABNORMAL TRANSAMINASE-LFT'S 02/28/2008    Past Surgical History:  Procedure Laterality Date  . CHOLECYSTECTOMY       OB History   No obstetric history on file.      Home Medications    Prior to Admission medications   Medication Sig Start Date End Date Taking? Authorizing Provider  acetaminophen (TYLENOL) 325 MG tablet Take 650 mg by mouth every 6 (six) hours as needed for mild pain or headache.   Yes [provider]  famotidine (PEPCID) 20 MG tablet Take 20 mg by mouth 2 (two) times daily as needed for heartburn  or indigestion.   Yes [provider]  Multiple Vitamin (MULTIVITAMIN WITH MINERALS) TABS tablet Take 1 tablet by mouth daily.   Yes [provider]  cyclobenzaprine (FLEXERIL) 10 MG tablet Take 1 tablet (10 mg total) by mouth 2 (two) times daily as needed for muscle spasms. 01/26/19   Gailen Shelter, PA  methylPREDNISolone (MEDROL DOSEPAK) 4 MG TBPK tablet As directed Patient not taking: Reported on 01/26/2019 06/17/15   Helane Gunther, DPM  omeprazole (PRILOSEC) 20 MG capsule Take 1 capsule (20 mg total) by mouth daily for 14 days. 01/26/19 02/09/19  Gailen Shelter, PA  promethazine (PHENERGAN) 25 MG tablet Take 1 tablet (25 mg total) by mouth every 6 (six) hours as needed for nausea or vomiting. Patient not taking: Reported on 01/26/2019 01/13/16   Gwyneth Sprout, MD  sucralfate (CARAFATE) 1 g tablet Take 1 tablet (1 g total) by mouth 4 (four) times daily for 14 days. 01/26/19 02/09/19  Gailen Shelter, PA    Family History No family history on file.  Social History Social History   Tobacco Use  . Smoking status: Never Smoker  . Smokeless tobacco: Never Used  Substance Use Topics  . Alcohol use: No    Alcohol/week: 0.0 standard drinks  . Drug use: No     Allergies   Diclofenac, Diclofenac sodium, and Oxycodone-acetaminophen   Review of Systems Review of Systems   Physical Exam Updated Vital Signs BP 107/75 (BP Location: Left  Arm)   Pulse 72   Temp 98.8 F (37.1 C) (Oral)   Resp 15   SpO2 96%   Physical Exam Vitals signs and nursing note reviewed.  Constitutional:      Appearance: She is obese.     Comments: Patient does not appear to be in any acute distress or pain however she does state that she hurts all over.  HENT:     Head: Normocephalic and atraumatic.     Nose: Nose normal.     Mouth/Throat:     Mouth: Mucous membranes are moist.  Eyes:     General: No scleral icterus.    Comments: Cloudy corneas  Neck:     Musculoskeletal:  Normal range of motion.  Cardiovascular:     Rate and Rhythm: Normal rate and regular rhythm.     Pulses: Normal pulses.     Heart sounds: Normal heart sounds.  Pulmonary:     Effort: Pulmonary effort is normal. No respiratory distress.     Breath sounds: No wheezing.  Abdominal:     General: There is no distension.     Palpations: Abdomen is soft.     Tenderness: There is abdominal tenderness. There is no right CVA tenderness, left CVA tenderness or guarding.     Comments: Right upper quadrant tenderness with negative Murphy sign.  Mild tenderness to palpation of right flank.  Musculoskeletal:     Right lower leg: No edema.     Left lower leg: No edema.     Comments: Mid thoracic back tenderness that does not reproduce discussed pain. No midline tenderness.   Skin:    General: Skin is warm and dry.     Capillary Refill: Capillary refill takes less than 2 seconds.  Neurological:     Mental Status: She is alert. Mental status is at baseline.  Psychiatric:        Mood and Affect: Mood normal.        Behavior: Behavior normal.      ED Treatments / Results  Labs (all labs ordered are listed, but only abnormal results are displayed) Labs Reviewed  COMPREHENSIVE METABOLIC PANEL - Abnormal; Notable for the following components:      Result Value   Total Protein 8.7 (*)    Total Bilirubin 1.5 (*)    All other components within normal limits  CBC - Abnormal; Notable for the following components:   Hemoglobin 15.2 (*)    HCT 47.5 (*)    All other components within normal limits  URINALYSIS, ROUTINE W REFLEX MICROSCOPIC - Abnormal; Notable for the following components:   APPearance HAZY (*)    All other components within normal limits  LIPASE, BLOOD  I-STAT BETA HCG BLOOD, ED ([facility], [facility], [facility] ONLY)    EKG None  Radiology Ct Renal Stone Study  Result Date: 01/26/2019 CLINICAL DATA:  Right flank pain. EXAM: CT ABDOMEN AND PELVIS WITHOUT CONTRAST TECHNIQUE: Multidetector CT  imaging of the abdomen and pelvis was performed following the standard protocol without IV contrast. COMPARISON:  02/12/2008 FINDINGS: Lower chest: No acute abnormality. Hepatobiliary: No focal liver abnormality is seen. Status post cholecystectomy. No biliary dilatation. Pancreas: Unremarkable. No pancreatic ductal dilatation or surrounding inflammatory changes. Spleen: Normal in size without focal abnormality. Adrenals/Urinary Tract: Adrenal glands are unremarkable. Kidneys are normal, without renal calculi, focal lesion, or hydronephrosis. Bladder is unremarkable. Stomach/Bowel: Stomach is within normal limits. Appendix appears normal. No evidence of bowel wall thickening, distention, or inflammatory changes. Vascular/Lymphatic:  No significant vascular findings are present. No enlarged abdominal or pelvic lymph nodes. Reproductive: Uterus and bilateral adnexa are unremarkable. Other: Small fat containing umbilical hernia. No ascites. Musculoskeletal: Posterior and interbody fusion of L5-S1. No acute osseous abnormality. IMPRESSION: 1. No acute abdominopelvic findings. Specifically, no nephrolithiasis or obstructive uropathy. 2. Status post cholecystectomy. 3. Small fat containing umbilical hernia. Electronically Signed   By: Davina Poke M.D.   On: 01/26/2019 14:39    Procedures Procedures (including critical care time)  Medications Ordered in ED Medications - No data to display   Initial Impression / Assessment and Plan / ED Course  I have reviewed the triage vital signs and the nursing notes.  Pertinent labs & imaging results that were available during my care of the patient were reviewed by me and considered in my medical decision making (see chart for details).        Patient has 1 month history of intermittent back pain that wraps around under her ribs right > left. Patient's history of episodes occur after large meals consistent with PUD.  Patient concerned for other source of pain  and states she called [facility] but wasn't able to get in until next week.  Discussed with attending physician who assessed patient as well. CT renal scan ordered to assess for intraabdominal pathology which shows no explanation for patient's abdominal pain.   Discussed with patient that pain is likely either musculoskeletal or peptic ulcer disease.  Patient prescribed Carafate and Prilosec for likely PUD and cyclobenzaprine to use as needed as a muscle relaxer in case etiology is muscular in origin.  Patient given instructions to call of our GI for further evaluation.  Patient understands the plan.  Vitals within normal limits during entirety of ED visit.  Doubt any acute pathology to explain abdominal pain as pain has been ongoing for a month and is worse with eating -- patient counseled on PUD diet and limiting large meals as this worsens pain and limiting ibuprofen and using tylenol instead.      Final Clinical Impressions(s) / ED Diagnoses   Final diagnoses:  Pain of upper abdomen    ED Discharge Orders         Ordered    cyclobenzaprine (FLEXERIL) 10 MG tablet  2 times daily PRN     01/26/19 1457    omeprazole (PRILOSEC) 20 MG capsule  Daily     01/26/19 1457    sucralfate (CARAFATE) 1 g tablet  4 times daily     01/26/19 1501           Pati Gallo [place], [place] 01/26/19 1548    Veryl Speak, MD 01/29/19 [phone]

## 2019-02-03 DIAGNOSIS — M5414 Radiculopathy, thoracic region: Secondary | ICD-10-CM | POA: Diagnosis not present

## 2019-02-07 ENCOUNTER — Other Ambulatory Visit: Payer: Self-pay | Admitting: Neurological Surgery

## 2019-02-07 ENCOUNTER — Other Ambulatory Visit (HOSPITAL_COMMUNITY): Payer: Self-pay | Admitting: Neurological Surgery

## 2019-02-07 DIAGNOSIS — M5414 Radiculopathy, thoracic region: Secondary | ICD-10-CM

## 2019-02-21 DIAGNOSIS — M5414 Radiculopathy, thoracic region: Secondary | ICD-10-CM | POA: Diagnosis not present

## 2019-02-21 DIAGNOSIS — M4724 Other spondylosis with radiculopathy, thoracic region: Secondary | ICD-10-CM | POA: Diagnosis not present

## 2019-02-21 DIAGNOSIS — M5114 Intervertebral disc disorders with radiculopathy, thoracic region: Secondary | ICD-10-CM | POA: Diagnosis not present

## 2019-05-25 DIAGNOSIS — L309 Dermatitis, unspecified: Secondary | ICD-10-CM | POA: Diagnosis not present

## 2019-05-25 DIAGNOSIS — L299 Pruritus, unspecified: Secondary | ICD-10-CM | POA: Diagnosis not present

## 2019-07-10 DIAGNOSIS — Z1389 Encounter for screening for other disorder: Secondary | ICD-10-CM | POA: Diagnosis not present

## 2019-07-10 DIAGNOSIS — Z01419 Encounter for gynecological examination (general) (routine) without abnormal findings: Secondary | ICD-10-CM | POA: Diagnosis not present

## 2019-07-10 DIAGNOSIS — Z130 Encounter for screening for diseases of the blood and blood-forming organs and certain disorders involving the immune mechanism: Secondary | ICD-10-CM | POA: Diagnosis not present

## 2019-07-10 DIAGNOSIS — Z1151 Encounter for screening for human papillomavirus (HPV): Secondary | ICD-10-CM | POA: Diagnosis not present

## 2019-07-10 DIAGNOSIS — Z6831 Body mass index (BMI) 31.0-31.9, adult: Secondary | ICD-10-CM | POA: Diagnosis not present

## 2019-07-10 DIAGNOSIS — Z124 Encounter for screening for malignant neoplasm of cervix: Secondary | ICD-10-CM | POA: Diagnosis not present

## 2019-07-10 DIAGNOSIS — Z1231 Encounter for screening mammogram for malignant neoplasm of breast: Secondary | ICD-10-CM | POA: Diagnosis not present

## 2020-06-18 IMAGING — CT CT RENAL STONE PROTOCOL
2 of 4 series · 17 of 46 positions shown, 19 images · non-contrast
Comparison: 02/12/2008

CLINICAL DATA: Right flank pain.

EXAM:
CT ABDOMEN AND PELVIS WITHOUT CONTRAST
TECHNIQUE: Multidetector CT imaging of the abdomen and pelvis was performed
following the standard protocol without IV contrast.

[Series 2: axial st · axial · 0.74mm/px · z∈[+1187,+1602]mm · 14 of 93 slices shown, 16 images]
[im 5/93  soft-tissue]
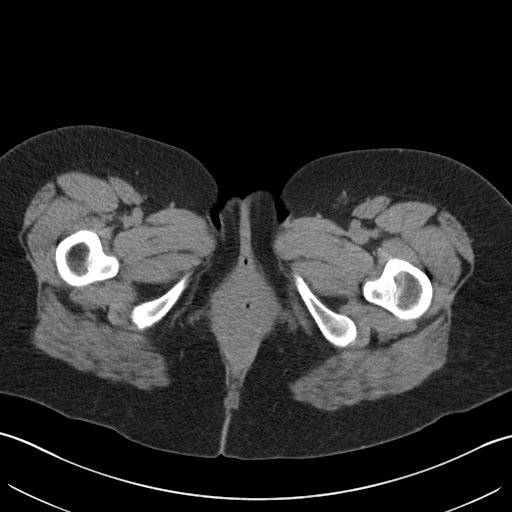
[im 5/93  bone]
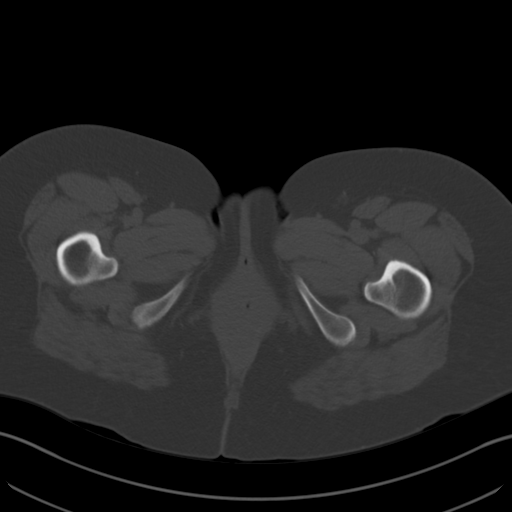
[im 10/93  soft-tissue]
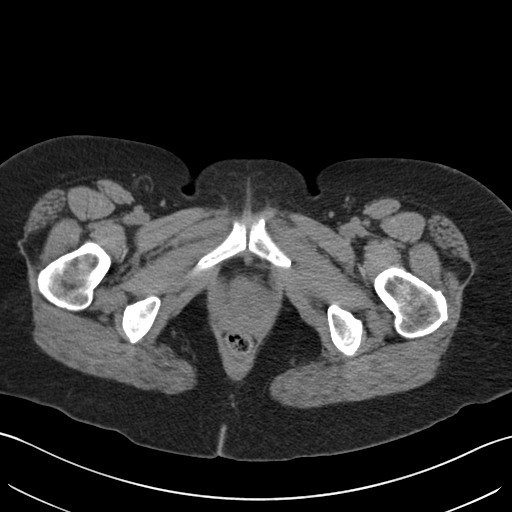
[im 20/93  soft-tissue]
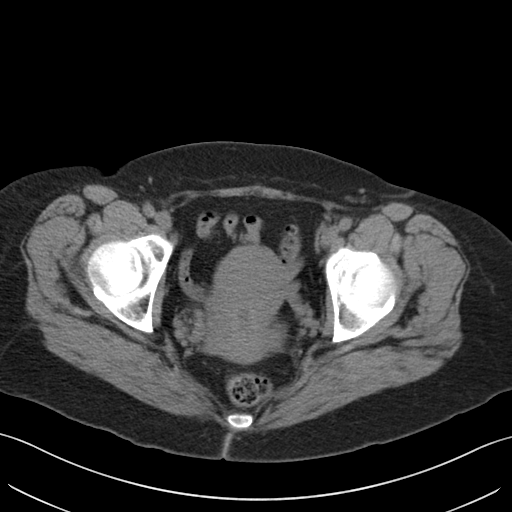
[im 25/93  soft-tissue]
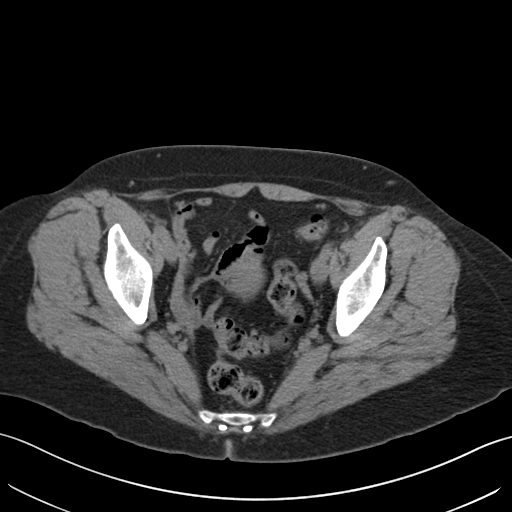
[im 30/93  soft-tissue]
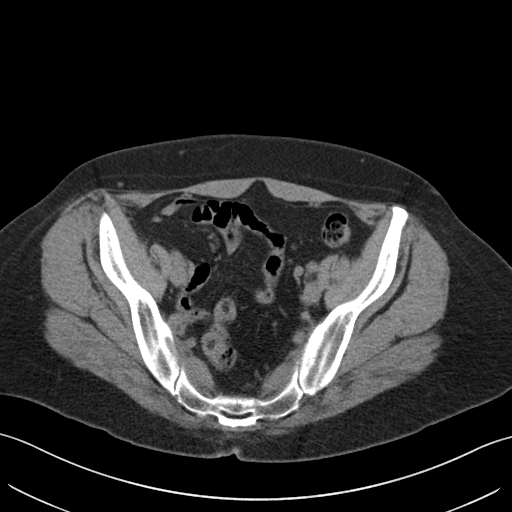
[im 39/93  soft-tissue]
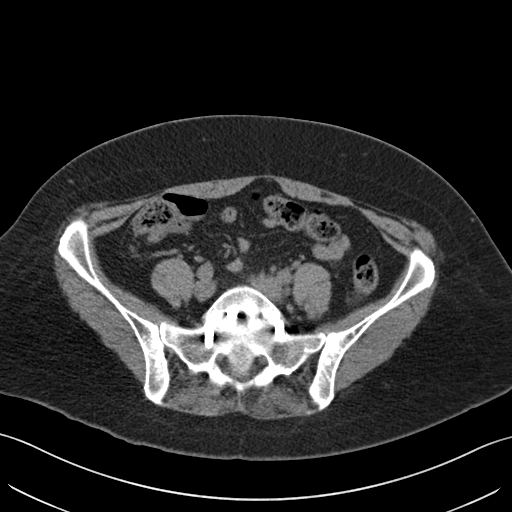
[im 44/93  soft-tissue]
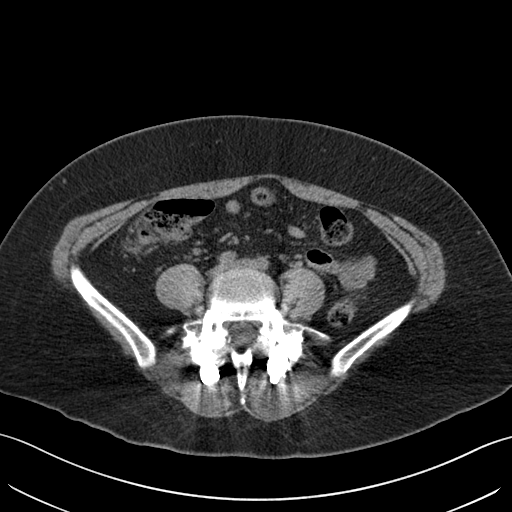
[im 49/93  soft-tissue]
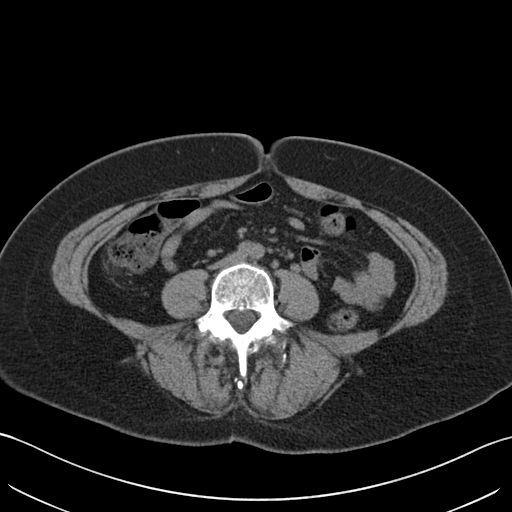
[im 54/93  soft-tissue]
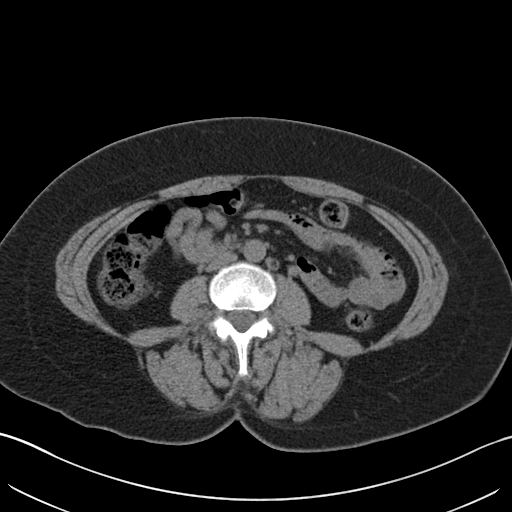
[im 54/93  bone]
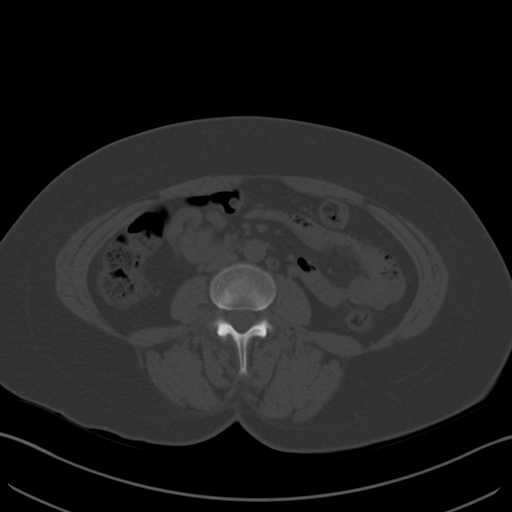
[im 63/93  soft-tissue]
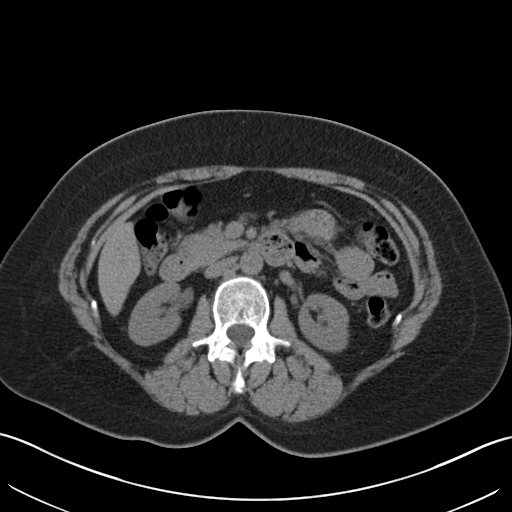
[im 68/93  soft-tissue]
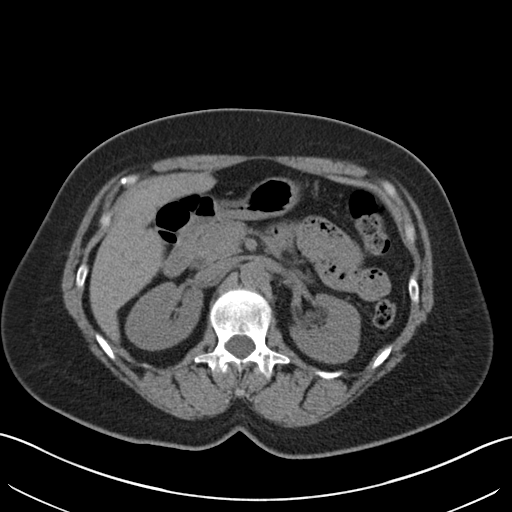
[im 73/93  soft-tissue]
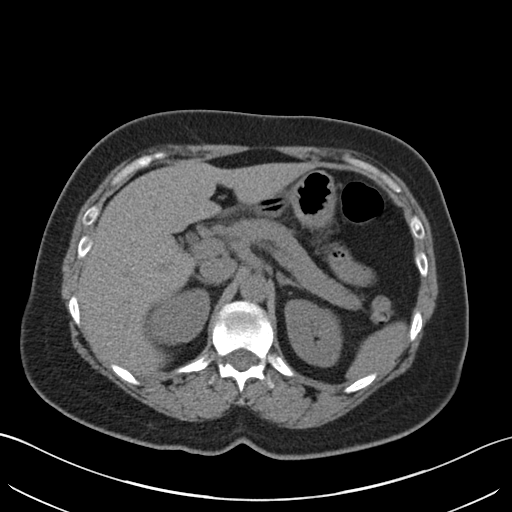
[im 83/93  soft-tissue]
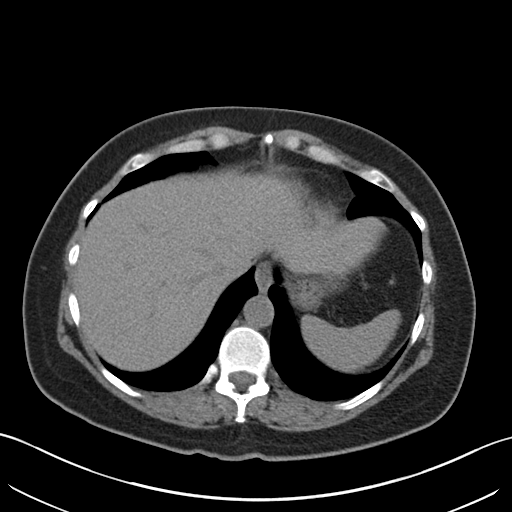
[im 88/93  soft-tissue]
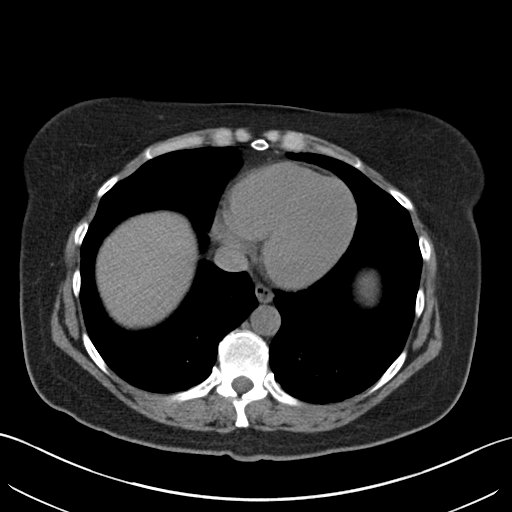

[Series 4: coronal · coronal · 1.02mm/px · 3 of 104 slices shown]
[im 35/104  soft-tissue]
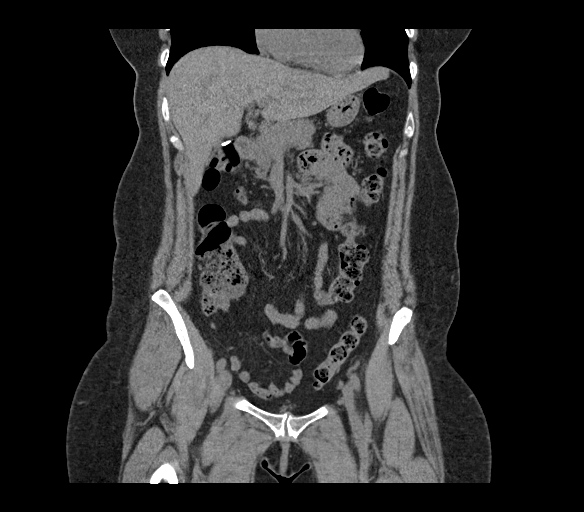
[im 46/104  soft-tissue]
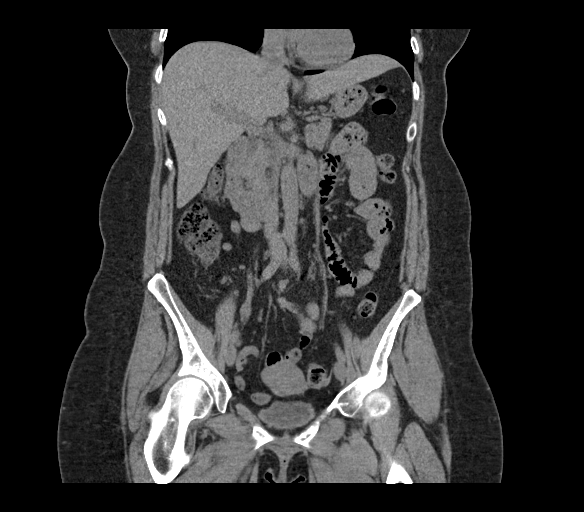
[im 58/104  soft-tissue]
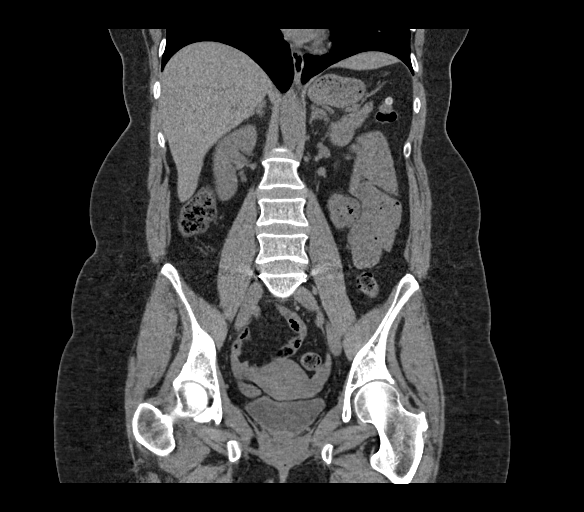

[17 of 46 positions shown; findings below may reference images not displayed]

FINDINGS: Lower chest: No acute abnormality.

Hepatobiliary: No focal liver abnormality is seen. Status post
cholecystectomy. No biliary dilatation.

Pancreas: Unremarkable. No pancreatic ductal dilatation or
surrounding inflammatory changes.

Spleen: Normal in size without focal abnormality.

Adrenals/Urinary Tract: Adrenal glands are unremarkable. Kidneys are
normal, without renal calculi, focal lesion, or hydronephrosis.
Bladder is unremarkable.

Stomach/Bowel: Stomach is within normal limits. Appendix appears
normal. No evidence of bowel wall thickening, distention, or
inflammatory changes.

Vascular/Lymphatic: No significant vascular findings are present. No
enlarged abdominal or pelvic lymph nodes.

Reproductive: Uterus and bilateral adnexa are unremarkable.

Other: Small fat containing umbilical hernia. No ascites.

Musculoskeletal: Posterior and interbody fusion of L5-S1. No acute
osseous abnormality.
IMPRESSION: 1. No acute abdominopelvic findings. Specifically, no
nephrolithiasis or obstructive uropathy.
2. Status post cholecystectomy.
3. Small fat containing umbilical hernia.

## 2020-07-05 ENCOUNTER — Other Ambulatory Visit (HOSPITAL_COMMUNITY): Payer: Self-pay | Admitting: Registered Nurse

## 2020-07-05 ENCOUNTER — Ambulatory Visit (HOSPITAL_BASED_OUTPATIENT_CLINIC_OR_DEPARTMENT_OTHER)
Admission: RE | Admit: 2020-07-05 | Discharge: 2020-07-05 | Disposition: A | Discharge status: Home / Self Care | Payer: BC Managed Care – PPO | Source: Ambulatory Visit | Attending: Registered Nurse | Admitting: Registered Nurse

## 2020-07-05 ENCOUNTER — Other Ambulatory Visit: Payer: Self-pay

## 2020-07-05 DIAGNOSIS — K429 Umbilical hernia without obstruction or gangrene: Secondary | ICD-10-CM | POA: Insufficient documentation

## 2020-07-05 DIAGNOSIS — R109 Unspecified abdominal pain: Secondary | ICD-10-CM

## 2020-07-05 DIAGNOSIS — R1031 Right lower quadrant pain: Secondary | ICD-10-CM | POA: Diagnosis present

## 2020-07-05 MED ORDER — IOHEXOL 300 MG/ML  SOLN
100.0000 mL | Freq: Once | INTRAMUSCULAR | Status: AC | PRN
  Administered 2020-07-05: 100 mL via INTRAVENOUS

## 2021-01-03 DIAGNOSIS — Z0000 Encounter for general adult medical examination without abnormal findings: Secondary | ICD-10-CM | POA: Diagnosis not present

## 2021-01-03 DIAGNOSIS — Z131 Encounter for screening for diabetes mellitus: Secondary | ICD-10-CM | POA: Diagnosis not present

## 2021-01-03 DIAGNOSIS — E663 Overweight: Secondary | ICD-10-CM | POA: Diagnosis not present

## 2021-01-03 DIAGNOSIS — E538 Deficiency of other specified B group vitamins: Secondary | ICD-10-CM | POA: Diagnosis not present

## 2021-01-03 DIAGNOSIS — E559 Vitamin D deficiency, unspecified: Secondary | ICD-10-CM | POA: Diagnosis not present

## 2021-01-10 DIAGNOSIS — Z111 Encounter for screening for respiratory tuberculosis: Secondary | ICD-10-CM | POA: Diagnosis not present

## 2021-11-26 IMAGING — CT CT ABD-PELV W/ CM
1 of 3 series · 14 of 32 positions shown, 19 images · IV contrast (APPLIED)
Comparison: CT abdomen and pelvis 01/26/2019.

CLINICAL DATA: Upper abdominal pain which is worse with movement.

EXAM:
CT ABDOMEN AND PELVIS WITH CONTRAST
TECHNIQUE: Multidetector CT imaging of the abdomen and pelvis was performed
using the standard protocol following bolus administration of
intravenous contrast.
CONTRAST:  100 mL OMNIPAQUE IOHEXOL 300 MG/ML  SOLN

[Series 2: abd pel w · axial · 0.71mm/px · z∈[+696,+1121]mm · 14 of 97 slices shown, 19 images]
[im 6/97  soft-tissue]
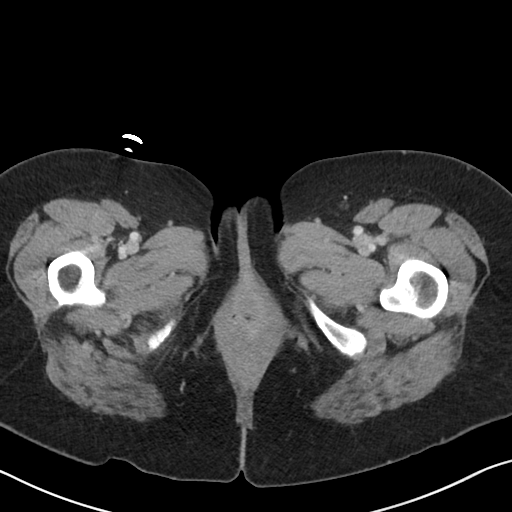
[im 6/97  bone]
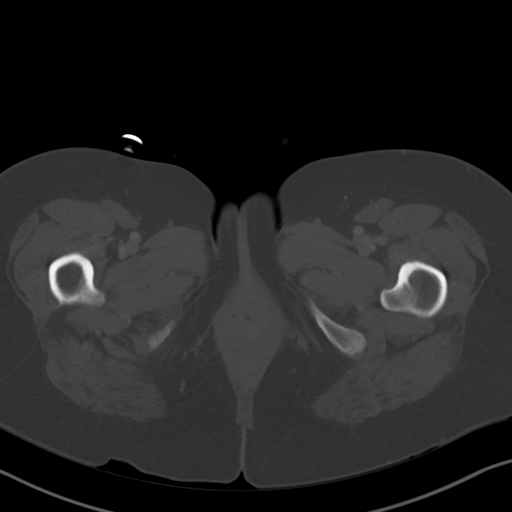
[im 16/97  soft-tissue]
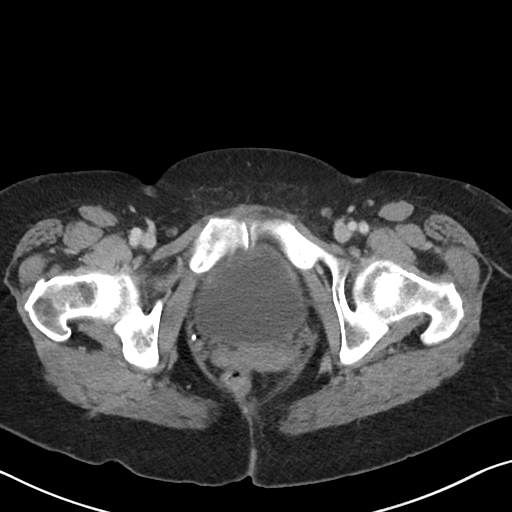
[im 21/97  soft-tissue]
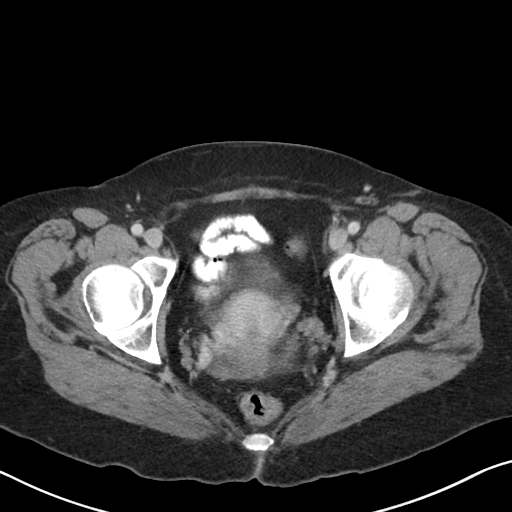
[im 26/97  soft-tissue]
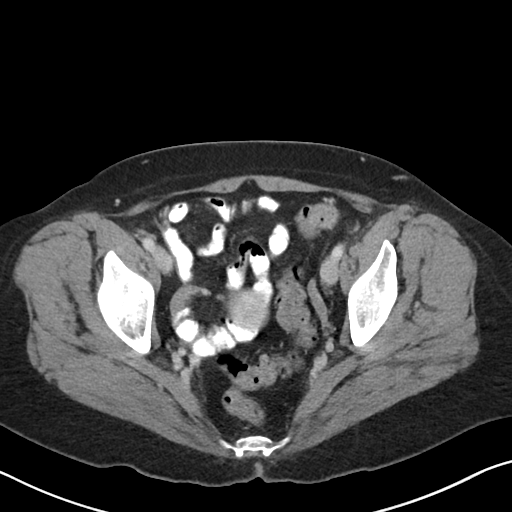
[im 36/97  soft-tissue]
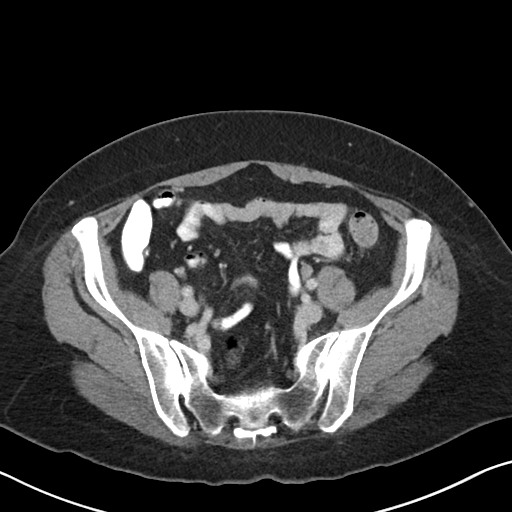
[im 41/97  soft-tissue]
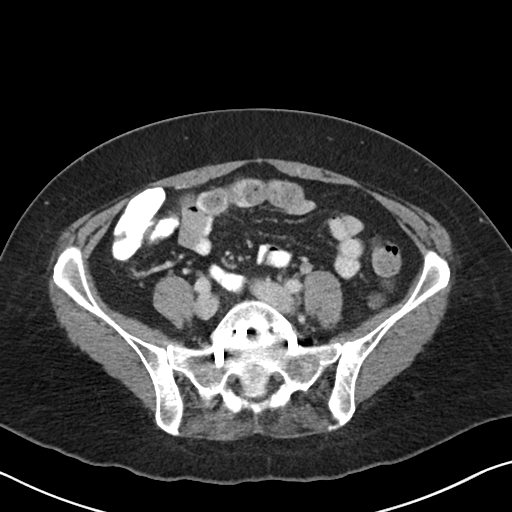
[im 51/97  soft-tissue]
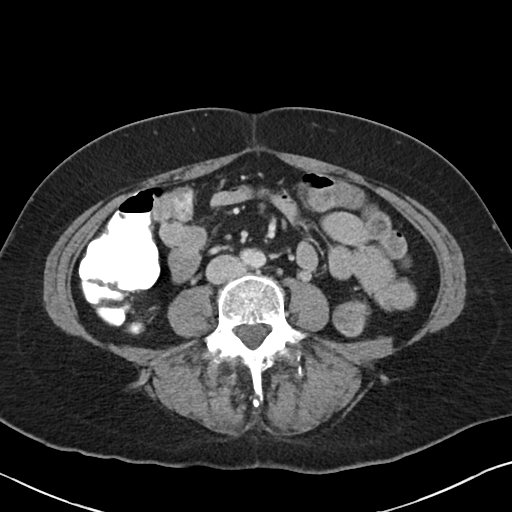
[im 56/97  soft-tissue]
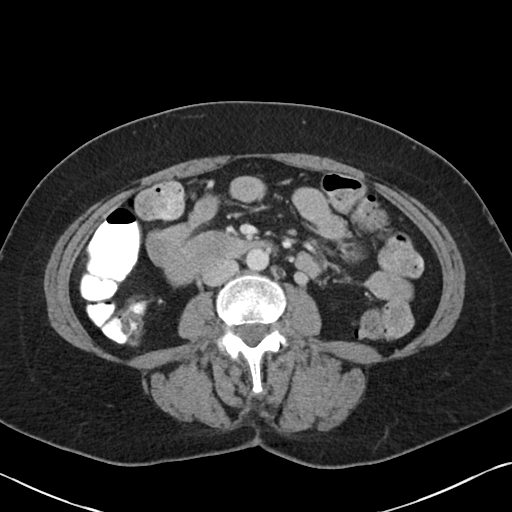
[im 61/97  soft-tissue]
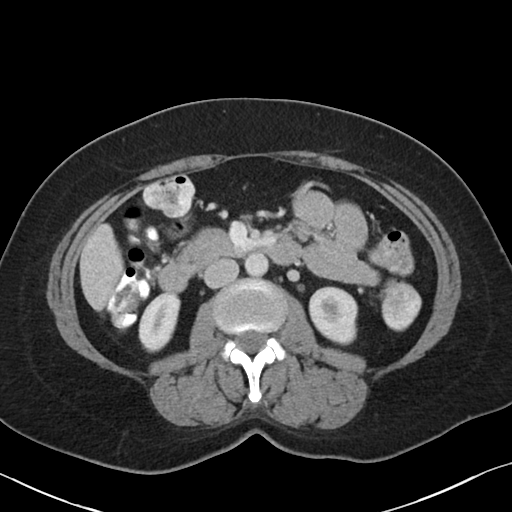
[im 61/97  bone]
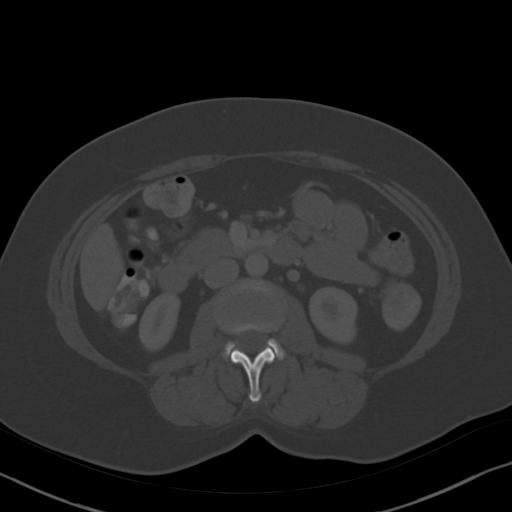
[im 71/97  soft-tissue]
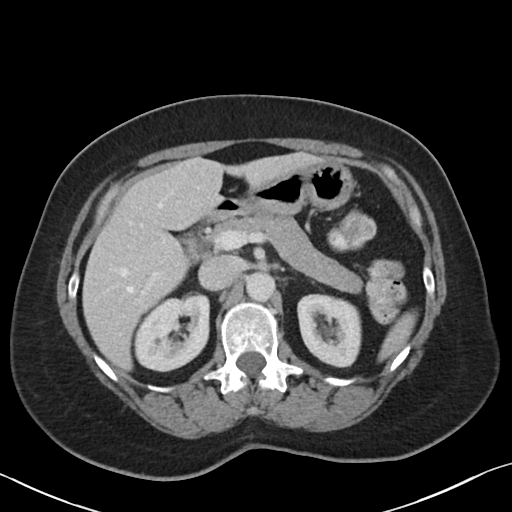
[im 76/97  soft-tissue]
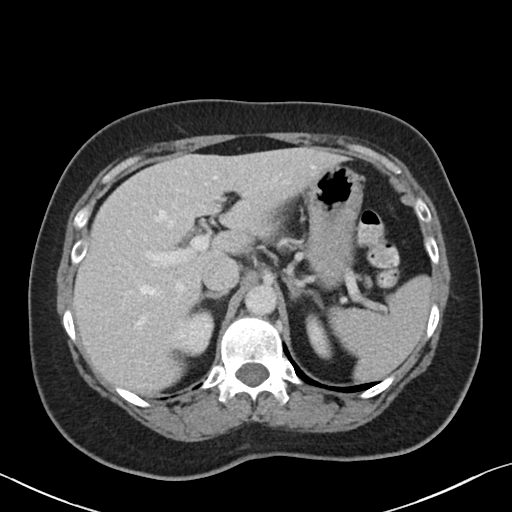
[im 76/97  lung]
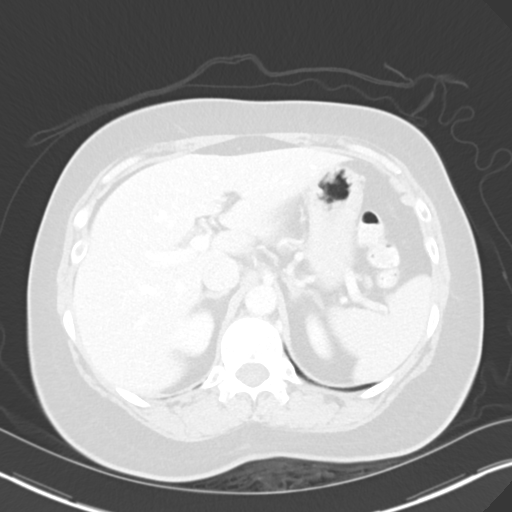
[im 81/97  soft-tissue]
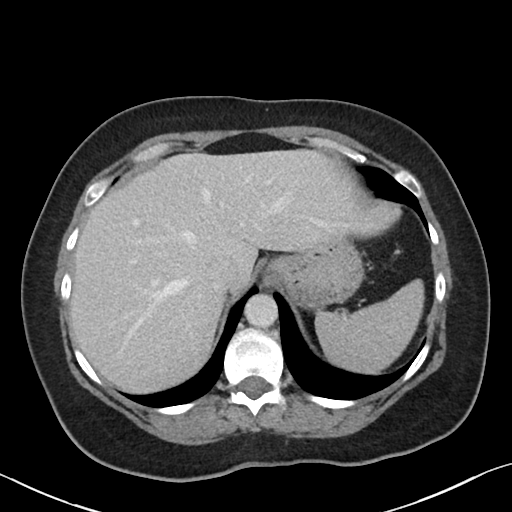
[im 81/97  lung]
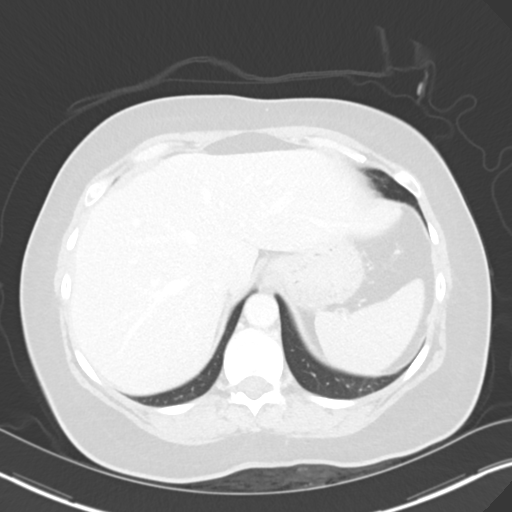
[im 86/97  lung]
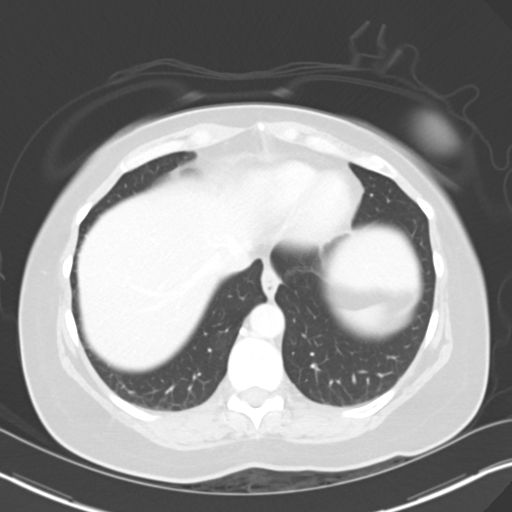
[im 91/97  soft-tissue]
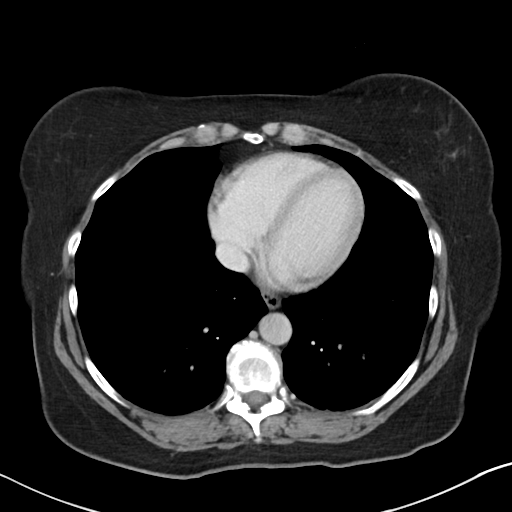
[im 91/97  lung]
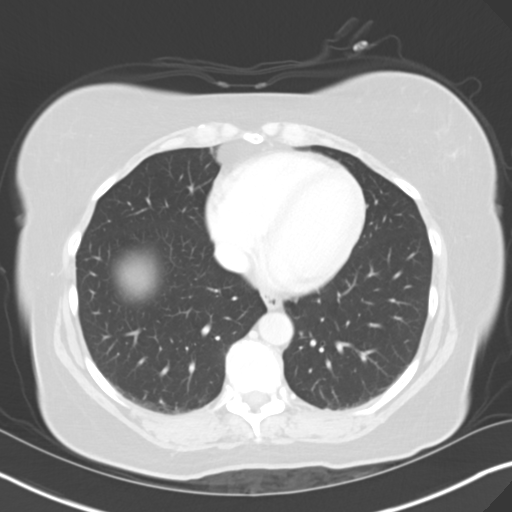

[14 of 32 positions shown; findings below may reference images not displayed]

FINDINGS: Lower chest: Lung bases clear.  No pleural or pericardial effusion.

Hepatobiliary: No focal liver abnormality is seen. Status post
cholecystectomy. No biliary dilatation.

Pancreas: Unremarkable. No pancreatic ductal dilatation or
surrounding inflammatory changes.

Spleen: Normal in size without focal abnormality.

Adrenals/Urinary Tract: Adrenal glands are unremarkable. Kidneys are
normal, without renal calculi, focal lesion, or hydronephrosis.
Bladder is unremarkable.

Stomach/Bowel: Stomach is within normal limits. Appendix appears
normal. No evidence of bowel wall thickening, distention, or
inflammatory changes.

Vascular/Lymphatic: No significant vascular findings are present. No
enlarged abdominal or pelvic lymph nodes.

Reproductive: Uterus and bilateral adnexa are unremarkable.

Other: Small fat containing umbilical hernia is unchanged. No
ascites.

Musculoskeletal: No acute or focal abnormality. The patient is
status post L5-S1 fusion.
IMPRESSION: No acute abnormality or finding to explain the patient's symptoms.

Small fat containing umbilical hernia.

## 2022-10-05 ENCOUNTER — Encounter: Payer: Self-pay | Admitting: Neurology

## 2022-10-23 ENCOUNTER — Other Ambulatory Visit: Payer: Self-pay | Admitting: Rehabilitation

## 2022-10-23 DIAGNOSIS — M5412 Radiculopathy, cervical region: Secondary | ICD-10-CM

## 2022-11-04 ENCOUNTER — Encounter: Payer: Self-pay | Admitting: Rehabilitation

## 2022-11-06 ENCOUNTER — Ambulatory Visit
Admission: RE | Admit: 2022-11-06 | Discharge: 2022-11-06 | Disposition: A | Discharge status: Home / Self Care | Payer: BC Managed Care – PPO | Source: Ambulatory Visit | Attending: Rehabilitation | Admitting: Rehabilitation

## 2022-11-06 DIAGNOSIS — M5412 Radiculopathy, cervical region: Secondary | ICD-10-CM

## 2023-02-03 NOTE — Progress Notes (Deleted)
NEUROLOGY CONSULTATION NOTE  [name] MRN: [id number] DOB: 22-May-1970  Referring provider: Burke Keels, NP Primary care provider: Marylynn Pearson, MD  Reason for consult:  multiple sclerosis  Assessment/Plan:   ***   Subjective:  [name] is a 52 year old ***-handed female with chronic pain, osteoarthritis, and anxiety who presents to establish care for multiple sclerosis.  History supplemented by prior neurologist's and referring provider's notes.  MRIs of brain from 2008 and 2016 and MRI C-spine from 2008 personally reviewed.  Patient has a complicated history and past notes are scarce.  In 2008, she developed numbness and weakness.  She endorsed facial numbness, left hand numbness and right foot numbness.  She did see a neurologist at that time.  MRI of braine with and without contrast on 03/04/2007 revealed few/minimal scattered nonspecific T2 FLAIR punctate foci within the cerebral white matter.  MRI of cervical spine revealed no degenerative spine disease or cord pathology.  ***  She was on Copaxone ***.  She had a repeat MRI of brain with and without contrast on 05/14/2014 which was stable when compared to imaging from 03/03/2007.       PAST MEDICAL HISTORY: No past medical history on file.  PAST SURGICAL HISTORY: Past Surgical History:  Procedure Laterality Date   CHOLECYSTECTOMY      MEDICATIONS: Current Outpatient Medications on File Prior to Visit  Medication Sig Dispense Refill   acetaminophen (TYLENOL) 325 MG tablet Take 650 mg by mouth every 6 (six) hours as needed for mild pain or headache.     cyclobenzaprine (FLEXERIL) 10 MG tablet Take 1 tablet (10 mg total) by mouth 2 (two) times daily as needed for muscle spasms. 20 tablet 0   famotidine (PEPCID) 20 MG tablet Take 20 mg by mouth 2 (two) times daily as needed for heartburn or indigestion.     methylPREDNISolone (MEDROL DOSEPAK) 4 MG TBPK tablet As directed (Patient not taking: Reported on  01/26/2019) 30 tablet 0   Multiple Vitamin (MULTIVITAMIN WITH MINERALS) TABS tablet Take 1 tablet by mouth daily.     omeprazole (PRILOSEC) 20 MG capsule Take 1 capsule (20 mg total) by mouth daily for 14 days. 14 capsule 0   promethazine (PHENERGAN) 25 MG tablet Take 1 tablet (25 mg total) by mouth every 6 (six) hours as needed for nausea or vomiting. (Patient not taking: Reported on 01/26/2019) 15 tablet 0   sucralfate (CARAFATE) 1 g tablet Take 1 tablet (1 g total) by mouth 4 (four) times daily for 14 days. 56 tablet 0   No current facility-administered medications on file prior to visit.    ALLERGIES: Allergies  Allergen Reactions   Diclofenac Other (See Comments)    Headache   Diclofenac Sodium     Other reaction(s): Other Headache   Oxycodone-Acetaminophen     REACTION: itching    FAMILY HISTORY: No family history on file.  Objective:  *** General: No acute distress.  Patient appears well-groomed.   Head:  Normocephalic/atraumatic Eyes:  fundi examined but not visualized Neck: supple, no paraspinal tenderness, full range of motion Back: No paraspinal tenderness Heart: regular rate and rhythm Lungs: Clear to auscultation bilaterally. Vascular: No carotid bruits. Neurological Exam: Mental status: alert and oriented to person, place, and time, speech fluent and not dysarthric, language intact. Cranial nerves: CN I: not tested CN II: pupils equal, round and reactive to light, visual fields intact CN III, IV, VI:  full range of motion, no nystagmus, no ptosis  CN V: facial sensation intact. CN VII: upper and lower face symmetric CN VIII: hearing intact CN IX, X: gag intact, uvula midline CN XI: sternocleidomastoid and trapezius muscles intact CN XII: tongue midline Bulk & Tone: normal, no fasciculations. Motor:  muscle strength 5/5 throughout Sensation:  Pinprick, temperature and vibratory sensation intact. Deep Tendon Reflexes:  2+ throughout,  toes downgoing.    Finger to nose testing:  Without dysmetria.   Heel to shin:  Without dysmetria.   Gait:  Normal station and stride.  Romberg negative.    Thank you for allowing me to take part in the care of this patient.  Shon Millet, DO  CC: ***

## 2023-02-05 ENCOUNTER — Ambulatory Visit: Payer: BC Managed Care – PPO | Admitting: Neurology

## 2023-05-17 NOTE — Progress Notes (Deleted)
 NEUROLOGY CONSULTATION NOTE  [name] MRN: [id number] DOB: 25-Aug-1970  Referring provider: Lauraine Sic, NP Primary care provider: Redell Kuster, MD  Reason for consult:  multiple sclerosis  Assessment/Plan:   ***   Subjective:  [name] is a 53 year old ***-handed female who presents to establish care for multiple sclerosis.  History supplemented by prior neurologist's and referring provider's notes.  ***  Vision:  *** Motor:  *** Sensory:  Left arm numbness due to cervical radiculopathy.  Left foot numbness and tingling Pain:  Chronic neck pain with left arm numbness.  Has cervical spondylosis with radiculopathy.  Low back pain with left foot numbness and tingling Gait:  *** Bowel/Bladder:  *** Fatigue:  *** Cognition:  short term memory deficits Mood:  ***  Current DMT:  *** Past DMT:  [facility]  Other current medications:  duloxetine (anxiety/OA), gabapentin (hot flashes)   Imaging: 11/06/2022 MRI C-SPINE WO:  1. Progressive now severe left facet arthropathy at C4-C5 with new moderate left neuroforaminal stenosis. 2. New and progressive mild spondylosis elsewhere without high-grade stenosis. 3. Normal cord signal and morphology 05/14/2014 MRI BRAIN W WO:  Scattered bilateral white matter lesions are stable since 2008. Most these are in the subcortical and deep white matter, not typical for multiple sclerosis but definitely possible. No new lesions. 03/04/2007 MRI BRAIN W WO:  The patient does have a few scattered foci of abnormal signal in the hemispheric white matter that could be a manifestation of multiple sclerosis. The patient does not have the most typical presentation of deep white matter and callosal involvement, but nonetheless multiple sclerosis is a possibility.  03/04/2007 MRI C-SPINE W WO:  No cord pathology in the cervical region.  No significant degenerative disease or compressive pathology.   Labs: 12/08/2012 Serum:  ANA negative, CCP  antibodies 10, C3 136, C4 34, anti-histone abs 1.7  PAST MEDICAL HISTORY: No past medical history on file.  PAST SURGICAL HISTORY: Past Surgical History:  Procedure Laterality Date   CHOLECYSTECTOMY      MEDICATIONS: Current Outpatient Medications on File Prior to Visit  Medication Sig Dispense Refill   acetaminophen (TYLENOL) 325 MG tablet Take 650 mg by mouth every 6 (six) hours as needed for mild pain or headache.     cyclobenzaprine  (FLEXERIL ) 10 MG tablet Take 1 tablet (10 mg total) by mouth 2 (two) times daily as needed for muscle spasms. 20 tablet 0   famotidine  (PEPCID ) 20 MG tablet Take 20 mg by mouth 2 (two) times daily as needed for heartburn or indigestion.     methylPREDNISolone  (MEDROL  DOSEPAK) 4 MG TBPK tablet As directed (Patient not taking: Reported on 01/26/2019) 30 tablet 0   Multiple Vitamin (MULTIVITAMIN WITH MINERALS) TABS tablet Take 1 tablet by mouth daily.     omeprazole  (PRILOSEC) 20 MG capsule Take 1 capsule (20 mg total) by mouth daily for 14 days. 14 capsule 0   promethazine  (PHENERGAN ) 25 MG tablet Take 1 tablet (25 mg total) by mouth every 6 (six) hours as needed for nausea or vomiting. (Patient not taking: Reported on 01/26/2019) 15 tablet 0   sucralfate  (CARAFATE ) 1 g tablet Take 1 tablet (1 g total) by mouth 4 (four) times daily for 14 days. 56 tablet 0   No current facility-administered medications on file prior to visit.    ALLERGIES: Allergies  Allergen Reactions   Diclofenac Other (See Comments)    Headache   Diclofenac Sodium     Other reaction(s): Other  Headache   Oxycodone-Acetaminophen     REACTION: itching    FAMILY HISTORY: No family history on file.  Objective:  *** General: No acute distress.  Patient appears well-groomed.   Head:  Normocephalic/atraumatic Eyes:  fundi examined but not visualized Neck: supple, no paraspinal tenderness, full range of motion Back: No paraspinal tenderness Heart: regular rate and  rhythm Lungs: Clear to auscultation bilaterally. Vascular: No carotid bruits. Neurological Exam: Mental status: alert and oriented to person, place, and time, speech fluent and not dysarthric, language intact. Cranial nerves: CN I: not tested CN II: pupils equal, round and reactive to light, visual fields intact CN III, IV, VI:  full range of motion, no nystagmus, no ptosis CN V: facial sensation intact. CN VII: upper and lower face symmetric CN VIII: hearing intact CN IX, X: gag intact, uvula midline CN XI: sternocleidomastoid and trapezius muscles intact CN XII: tongue midline Bulk & Tone: normal, no fasciculations. Motor:  muscle strength 5/5 throughout Sensation:  Pinprick, temperature and vibratory sensation intact. Deep Tendon Reflexes:  2+ throughout,  toes downgoing.   Finger to nose testing:  Without dysmetria.   Heel to shin:  Without dysmetria.   Gait:  Normal station and stride.  Romberg negative.    Thank you for allowing me to take part in the care of this patient.  Madison Dunnings, DO  CC: ***

## 2023-05-18 ENCOUNTER — Ambulatory Visit: Payer: BC Managed Care – PPO | Admitting: Neurology
